# Patient Record
Sex: Female | Born: 1945 | Race: Black or African American | Hispanic: No | Marital: Married | Smoking: Former smoker
Health system: Southern US, Community
[De-identification: ages and names within clinical notes are randomized; demographics above are authoritative.]

## PROBLEM LIST (undated history)

## (undated) DIAGNOSIS — I10 Essential (primary) hypertension: Secondary | ICD-10-CM

## (undated) DIAGNOSIS — M1990 Unspecified osteoarthritis, unspecified site: Secondary | ICD-10-CM

## (undated) HISTORY — DX: Unspecified osteoarthritis, unspecified site: M19.90

## (undated) HISTORY — PX: JOINT REPLACEMENT: SHX530

## (undated) HISTORY — PX: ABDOMINAL HYSTERECTOMY: SHX81

## (undated) HISTORY — PX: WISDOM TOOTH EXTRACTION: SHX21

## (undated) HISTORY — DX: Essential (primary) hypertension: I10

## (undated) HISTORY — PX: HERNIA REPAIR: SHX51

---

## 1984-04-20 HISTORY — PX: WISDOM TOOTH EXTRACTION: SHX21

## 1985-04-20 HISTORY — PX: ABDOMINAL HYSTERECTOMY: SHX81

## 2000-04-20 HISTORY — PX: FOOT SURGERY: SHX648

## 2006-04-20 HISTORY — PX: TOTAL HIP ARTHROPLASTY: SHX124

## 2014-02-06 ENCOUNTER — Ambulatory Visit (INDEPENDENT_AMBULATORY_CARE_PROVIDER_SITE_OTHER): Payer: Medicare Other | Admitting: Internal Medicine

## 2014-02-06 ENCOUNTER — Encounter: Payer: Self-pay | Admitting: Internal Medicine

## 2014-02-06 ENCOUNTER — Encounter: Payer: Self-pay | Admitting: *Deleted

## 2014-02-06 VITALS — BP 134/80 | HR 56 | Temp 98.2°F | Resp 16 | Ht 64.25 in | Wt 170.0 lb

## 2014-02-06 DIAGNOSIS — Z720 Tobacco use: Secondary | ICD-10-CM

## 2014-02-06 DIAGNOSIS — F1290 Cannabis use, unspecified, uncomplicated: Secondary | ICD-10-CM

## 2014-02-06 DIAGNOSIS — M542 Cervicalgia: Secondary | ICD-10-CM

## 2014-02-06 DIAGNOSIS — K219 Gastro-esophageal reflux disease without esophagitis: Secondary | ICD-10-CM | POA: Insufficient documentation

## 2014-02-06 DIAGNOSIS — Z90721 Acquired absence of ovaries, unilateral: Secondary | ICD-10-CM

## 2014-02-06 DIAGNOSIS — M150 Primary generalized (osteo)arthritis: Secondary | ICD-10-CM

## 2014-02-06 DIAGNOSIS — I10 Essential (primary) hypertension: Secondary | ICD-10-CM

## 2014-02-06 DIAGNOSIS — M1990 Unspecified osteoarthritis, unspecified site: Secondary | ICD-10-CM | POA: Insufficient documentation

## 2014-02-06 DIAGNOSIS — M159 Polyosteoarthritis, unspecified: Secondary | ICD-10-CM

## 2014-02-06 DIAGNOSIS — Z90710 Acquired absence of both cervix and uterus: Secondary | ICD-10-CM

## 2014-02-06 DIAGNOSIS — F17200 Nicotine dependence, unspecified, uncomplicated: Secondary | ICD-10-CM | POA: Insufficient documentation

## 2014-02-06 MED ORDER — CELECOXIB 100 MG PO CAPS: ORAL_CAPSULE | ORAL | Status: DC

## 2014-02-06 MED ORDER — PANTOPRAZOLE SODIUM 40 MG PO TBEC: 40.0000 mg | DELAYED_RELEASE_TABLET | Freq: Every day | ORAL | Status: DC

## 2014-02-06 NOTE — Progress Notes (Signed)
Subjective:    Patient ID: Kathryn Pruitt, female    DOB: [birth year], 68 y.o.   MRN: [id number]  HPI  New pt here for first visit for primary care.  She is a sister of a current pt [facility].  She is here with her husband.   Kathryn Pruitt has just moved back to [place] from [place].  She has lived and received care from [employer] base most of adult life  PMH DJD with bil hip replacment,  HTN,  Tobacco use,  And  Episodic marijuana use.  She is S/P hsyt with left oopherectomy.   She reports she is doing well,  Continues to smoke.  But has pain along neck and upper shoulders that has been chronic for her.  She has been on Tramadol and long term .Celebrex for this.  Denies injury or trauma.  Would occasionally use MJ for discomfort that was obatined from [place].    She has been on Celebrex for years  She would like ot try to come off this   Has  Occasional reflux.  Did not like Nexium  No Known Allergies Past Medical History  Diagnosis Date  . Hypertension   . Arthritis    Past Surgical History  Procedure Laterality Date  . Joint replacement    . Total hip arthroplasty Bilateral 2008  . Foot surgery Bilateral 2002  . Abdominal hysterectomy    . Wisdom tooth extraction     History   Social History  . Marital Status: Married    Spouse Name: N/A    Number of Children: N/A  . Years of Education: N/A   Occupational History  . Not on file.   Social History Main Topics  . Smoking status: Former Smoker -- 0.50 packs/day for 20 years    Types: Cigarettes  . Smokeless tobacco: Never Used  . Alcohol Use: Yes     Comment: A glass of wine about once a month   . Drug Use: Yes    Special: Marijuana     Comment: She smokes marijuana socially   . Sexual Activity: Yes    Partners: Male   Other Topics Concern  . Not on file   Social History Narrative  . No narrative on file   Family History  Problem Relation Age of Onset  . Lung cancer Mother   . Hypertension Mother   . Hypertension  Brother   . Bone cancer Maternal Grandmother    There are no active problems to display for this patient.  No current outpatient prescriptions on file prior to visit.   No current facility-administered medications on file prior to visit.      Review of Systems See HPI    Objective:   Physical Exam  Physical Exam  Nursing note and vitals reviewed.  See repeat BP Constitutional: She is oriented to person, place, and time. She appears well-developed and well-nourished.  HENT:  Head: Normocephalic and atraumatic.  Neck  NO pain upon ROM of neck.  She does have pain to palpation of Right shoulder blade Cardiovascular: Normal rate and regular rhythm. Exam reveals no gallop and no friction rub.  No murmur heard.  Pulmonary/Chest: Breath sounds normal. She has no wheezes. She has no rales.  Neurological: She is alert and oriented to person, place, and time.  Skin: Skin is warm and dry.  Psychiatric: She has a normal mood and affect. Her behavior is normal.  Assessment & Plan:  HTN  Continue meds  Tobacco use  Advised cessation she is no tready now   GERD  RX Protonix 40 mg daily  Chronic neck pain  Will refer to Dr. Niel Hummer for non narcotic pain management   Advised of recent study noting CV ,  MI and stroke risk with long term NSAIDS.  Will reduce Celebrex to 100 mg dialy  Ok to take EX tylenol in evening  DJD   See above   Labs todays  Schedule CPE

## 2014-02-07 LAB — CBC WITH DIFFERENTIAL/PLATELET
Basophils Absolute: 0 10*3/uL (ref 0.0–0.1)
Basophils Relative: 0 % (ref 0–1)
Eosinophils Absolute: 0.2 10*3/uL (ref 0.0–0.7)
Eosinophils Relative: 2 % (ref 0–5)
HCT: 42.6 % (ref 36.0–46.0)
HEMOGLOBIN: 14.3 g/dL (ref 12.0–15.0)
LYMPHS ABS: 3.5 10*3/uL (ref 0.7–4.0)
LYMPHS PCT: 41 % (ref 12–46)
MCH: 29.7 pg (ref 26.0–34.0)
MCHC: 33.6 g/dL (ref 30.0–36.0)
MCV: 88.6 fL (ref 78.0–100.0)
MONOS PCT: 6 % (ref 3–12)
Monocytes Absolute: 0.5 10*3/uL (ref 0.1–1.0)
Neutro Abs: 4.4 10*3/uL (ref 1.7–7.7)
Neutrophils Relative %: 51 % (ref 43–77)
PLATELETS: 261 10*3/uL (ref 150–400)
RBC: 4.81 MIL/uL (ref 3.87–5.11)
RDW: 14.7 % (ref 11.5–15.5)
WBC: 8.6 10*3/uL (ref 4.0–10.5)

## 2014-02-07 LAB — COMPLETE METABOLIC PANEL WITH GFR
ALBUMIN: 4 g/dL (ref 3.5–5.2)
ALT: 13 U/L (ref 0–35)
AST: 21 U/L (ref 0–37)
Alkaline Phosphatase: 72 U/L (ref 39–117)
BUN: 16 mg/dL (ref 6–23)
CALCIUM: 9.2 mg/dL (ref 8.4–10.5)
CHLORIDE: 104 meq/L (ref 96–112)
CO2: 29 mEq/L (ref 19–32)
Creat: 0.81 mg/dL (ref 0.50–1.10)
GFR, Est African American: 88 mL/min
GFR, Est Non African American: 76 mL/min
GLUCOSE: 86 mg/dL (ref 70–99)
POTASSIUM: 3.9 meq/L (ref 3.5–5.3)
Sodium: 140 mEq/L (ref 135–145)
TOTAL PROTEIN: 6.7 g/dL (ref 6.0–8.3)
Total Bilirubin: 0.7 mg/dL (ref 0.2–1.2)

## 2014-02-07 LAB — VITAMIN D 25 HYDROXY (VIT D DEFICIENCY, FRACTURES): VIT D 25 HYDROXY: 46 ng/mL (ref 30–89)

## 2014-02-07 LAB — LIPID PANEL
CHOL/HDL RATIO: 3.2 ratio
CHOLESTEROL: 214 mg/dL — AB (ref 0–200)
HDL: 67 mg/dL (ref >39)
LDL Cholesterol: 129 mg/dL — ABNORMAL HIGH (ref 0–99)
Triglycerides: 89 mg/dL (ref <150)
VLDL: 18 mg/dL (ref 0–40)

## 2014-02-07 LAB — TSH: TSH: 1.517 u[IU]/mL (ref 0.350–4.500)

## 2014-02-12 NOTE — Progress Notes (Signed)
Labs mailed to patient with a copy of the DASH Diet-eh

## 2014-02-28 ENCOUNTER — Encounter: Payer: Self-pay | Admitting: *Deleted

## 2014-05-03 ENCOUNTER — Other Ambulatory Visit: Payer: Self-pay | Admitting: *Deleted

## 2014-05-03 NOTE — Telephone Encounter (Signed)
Refill request

## 2014-05-06 ENCOUNTER — Other Ambulatory Visit: Payer: Self-pay | Admitting: Internal Medicine

## 2014-05-06 NOTE — Telephone Encounter (Signed)
Kathryn Pruitt  Call pt and let her know that her pain management Md Dr. Niel Hummer needs to be ordering all her pain meds including Celebrex

## 2014-05-07 NOTE — Telephone Encounter (Signed)
Eron is aware that Dr. Kirk RuthsAce Gins needs to be refilling all pain meds including Celebrex.

## 2014-05-14 ENCOUNTER — Other Ambulatory Visit: Payer: Self-pay | Admitting: *Deleted

## 2014-05-14 MED ORDER — PANTOPRAZOLE SODIUM 40 MG PO TBEC: 40.0000 mg | DELAYED_RELEASE_TABLET | Freq: Every day | ORAL | Status: DC

## 2014-05-14 NOTE — Telephone Encounter (Signed)
Refill request

## 2014-07-15 NOTE — Progress Notes (Addendum)
Subjective:    Patient ID: Kathryn Pruitt, female    DOB: Jun 02, 1945, 69 y.o.   MRN: [id number]  HPI 02/06/2014 New pt here for first visit for primary care. She is a sister of a current pt Kathryn Pruitt. She is here with her husband.   Kathryn Pruitt has just moved back to [place] from [place]. She has lived and received care from [employer] [facility] base most of adult life  PMH DJD with bil hip replacment, HTN, Tobacco use, And Episodic marijuana use. She is S/P hsyt with left oopherectomy.   She reports she is doing well, Continues to smoke. But has pain along neck and upper shoulders that has been chronic for her. She has been on Tramadol and long term .Celebrex for this. Denies injury or trauma. Would occasionally use MJ for discomfort that was obatined from [place]. She has been on Celebrex for years She would like ot try to come off this   Has Occasional reflux. Did not like Nexium Assessment & Plan:  HTN Continue meds  Tobacco use Advised cessation she is no tready now   GERD RX Protonix 40 mg daily  Chronic neck pain Will refer to Dr. Niel Hummer for non narcotic pain management Advised of recent study noting CV , MI and stroke risk with long term NSAIDS. Will reduce Celebrex to 100 mg dialy Ok to take EX tylenol in evening  DJD See above   Labs todays Kathryn Pruitt is here for CPE  HM  Had colonoscopy 2014 in [place] per pt,  She is due for mm, S/P hysterectomy,   Quit tobacco 20 years ago  Smoked off and on  < 30 pack year  HTN  Tolerating meds fine  DJD  She sees Dr.Dalton Ace Gins for pain control.   She still smoked marijauna on her "bad days"  No Known Allergies Past Medical History  Diagnosis Date  . Hypertension   . Arthritis    Past Surgical History  Procedure Laterality Date  . Joint replacement    . Total hip arthroplasty Bilateral 2008  . Foot surgery Bilateral 2002  . Abdominal hysterectomy    . Wisdom tooth  extraction     History   Social History  . Marital Status: Married    Spouse Name: N/A  . Number of Children: N/A  . Years of Education: N/A   Occupational History  . Not on file.   Social History Main Topics  . Smoking status: Former Smoker -- 0.50 packs/day for 20 years    Types: Cigarettes  . Smokeless tobacco: Never Used  . Alcohol Use: Yes     Comment: A glass of wine about once a month   . Drug Use: Yes    Special: Marijuana     Comment: She smokes marijuana socially   . Sexual Activity:    Partners: Male   Other Topics Concern  . Not on file   Social History Narrative  . No narrative on file   Family History  Problem Relation Age of Onset  . Lung cancer Mother   . Hypertension Mother   . Hypertension Brother   . Bone cancer Maternal Grandmother    Patient Active Problem List   Diagnosis Date Noted  . DJD (degenerative joint disease) 02/06/2014  . HTN (hypertension) 02/06/2014  . Tobacco use 02/06/2014  . Marijuana use, episodic 02/06/2014  . S/P hysterectomy with oophorectomy  has right ovary  02/06/2014  .  GERD (gastroesophageal reflux disease) 02/06/2014   Current Outpatient Prescriptions on File Prior to Visit  Medication Sig Dispense Refill  . celecoxib (CELEBREX) 100 MG capsule Take one daily 30 capsule 1  . felodipine (PLENDIL) 10 MG 24 hr tablet Take 10 mg by mouth daily.    . Fish Oil-Cholecalciferol (FISH OIL + D3) 1000-1000 MG-UNIT CAPS Take by mouth.    . fosinopril (MONOPRIL) 20 MG tablet Take 20 mg by mouth daily.    . hydrochlorothiazide (MICROZIDE) 12.5 MG capsule Take 12.5 mg by mouth daily.    . pantoprazole (PROTONIX) 40 MG tablet Take 1 tablet (40 mg total) by mouth daily. 90 tablet 1  . traMADol (ULTRAM) 50 MG tablet Take 50 mg by mouth every 6 (six) hours as needed.     No current facility-administered medications on file prior to visit.        Review of Systems  Respiratory: Negative for cough, chest tightness, shortness of  breath and wheezing.   Cardiovascular: Negative for chest pain, palpitations and leg swelling.       Objective:   Physical Exam  Physical Exam  Nursing note and vitals reviewed.  Constitutional: She is oriented to person, place, and time. She appears well-developed and well-nourished.  HENT:  Head: Normocephalic and atraumatic.  Right Ear: Tympanic membrane and ear canal normal. No drainage. Tympanic membrane is not injected and not erythematous.  Left Ear: Tympanic membrane and ear canal normal. No drainage. Tympanic membrane is not injected and not erythematous.  Nose: Nose normal. Right sinus exhibits no maxillary sinus tenderness and no frontal sinus tenderness. Left sinus exhibits no maxillary sinus tenderness and no frontal sinus tenderness.  Mouth/Throat: Oropharynx is clear and moist. No oral lesions. No oropharyngeal exudate.  Eyes: Conjunctivae and EOM are normal. Pupils are equal, round, and reactive to light.  Neck: Normal range of motion. Neck supple. No JVD present. Carotid bruit is not present. No mass and no thyromegaly present.  Cardiovascular: Normal rate, regular rhythm, S1 normal, S2 normal and intact distal pulses. Exam reveals no gallop and no friction rub.  No murmur heard.  Pulses:  Carotid pulses are 2+ on the right side, and 2+ on the left side.  Dorsalis pedis pulses are 2+ on the right side, and 2+ on the left side.  No carotid bruit. No LE edema  Pulmonary/Chest: Breath sounds normal. She has no wheezes. She has no rales. She exhibits no tenderness.  Breast no discrete mass no nipple discharge no axillary adenopathy bilaterally  Abdominal: Soft. Bowel sounds are normal. She exhibits no distension and no mass. There is no hepatosplenomegaly. There is no tenderness. There is no CVA tenderness.   Rectal no mass guaiac neg  Musculoskeletal: Normal range of motion.  No active synovitis to joints.  Lymphadenopathy:  She has no cervical adenopathy.  She has no  axillary adenopathy.  Right: No inguinal and no supraclavicular adenopathy present.  Left: No inguinal and no supraclavicular adenopathy present.  Neurological: She is alert and oriented to person, place, and time. She has normal strength and normal reflexes. She displays no tremor. No cranial nerve deficit or sensory deficit. Coordination and gait normal.  Skin: Skin is warm and dry. No rash noted. No cyanosis. Nails show no clubbing.  Psychiatric: She has a normal mood and affect. Her speech is normal and behavior is normal. Cognition and memory are normal.          Assessment & Plan:  HM:  Will schedule 3 D mm and DEXA.   S/P hyst  < 30 pack year smoker,  Quit 20 years ago  Colonoscopy done 2014 in [place]  HTN  Continue meds  Check chemistries today   GERD continue PPI  DJD / chronic pain  Managed by Dr Ace Gins   Addendum 4/25  See mm [facility]  Repeat films done 4/1 and compared to 2013 show left breast mass stable and felt to be benign.  Routine one year screening recommended.  I did not palpate a mass on exam

## 2014-07-16 ENCOUNTER — Encounter: Payer: Self-pay | Admitting: Internal Medicine

## 2014-07-16 ENCOUNTER — Other Ambulatory Visit: Payer: Self-pay | Admitting: *Deleted

## 2014-07-16 ENCOUNTER — Telehealth: Payer: Self-pay | Admitting: *Deleted

## 2014-07-16 ENCOUNTER — Ambulatory Visit (INDEPENDENT_AMBULATORY_CARE_PROVIDER_SITE_OTHER): Payer: Medicare Other | Admitting: Internal Medicine

## 2014-07-16 VITALS — BP 136/86 | HR 53 | Resp 16 | Ht 64.25 in | Wt 176.0 lb

## 2014-07-16 DIAGNOSIS — M159 Polyosteoarthritis, unspecified: Secondary | ICD-10-CM

## 2014-07-16 DIAGNOSIS — Z1211 Encounter for screening for malignant neoplasm of colon: Secondary | ICD-10-CM | POA: Diagnosis not present

## 2014-07-16 DIAGNOSIS — Z780 Asymptomatic menopausal state: Secondary | ICD-10-CM

## 2014-07-16 DIAGNOSIS — I10 Essential (primary) hypertension: Secondary | ICD-10-CM | POA: Diagnosis not present

## 2014-07-16 DIAGNOSIS — Z1231 Encounter for screening mammogram for malignant neoplasm of breast: Secondary | ICD-10-CM

## 2014-07-16 DIAGNOSIS — M150 Primary generalized (osteo)arthritis: Secondary | ICD-10-CM | POA: Diagnosis not present

## 2014-07-16 DIAGNOSIS — Z0000 Encounter for general adult medical examination without abnormal findings: Secondary | ICD-10-CM

## 2014-07-16 LAB — COMPREHENSIVE METABOLIC PANEL
ALK PHOS: 91 U/L (ref 39–117)
ALT: 15 U/L (ref 0–35)
AST: 20 U/L (ref 0–37)
Albumin: 4.3 g/dL (ref 3.5–5.2)
BUN: 16 mg/dL (ref 6–23)
CO2: 29 mEq/L (ref 19–32)
CREATININE: 0.94 mg/dL (ref 0.50–1.10)
Calcium: 9.7 mg/dL (ref 8.4–10.5)
Chloride: 98 mEq/L (ref 96–112)
Glucose, Bld: 85 mg/dL (ref 70–99)
Potassium: 3.6 mEq/L (ref 3.5–5.3)
Sodium: 136 mEq/L (ref 135–145)
Total Bilirubin: 1 mg/dL (ref 0.2–1.2)
Total Protein: 6.9 g/dL (ref 6.0–8.3)

## 2014-07-16 LAB — HEMOCCULT GUIAC POC 1CARD (OFFICE): Fecal Occult Blood, POC: NEGATIVE

## 2014-07-16 NOTE — Telephone Encounter (Signed)
I left a message for Kathryn Pruitt letting her know that she is scheduled with [facility] on 07/20/14 @ 2pm for her mammogram and DEXA -eh

## 2014-07-17 ENCOUNTER — Other Ambulatory Visit: Payer: Self-pay | Admitting: *Deleted

## 2014-07-17 ENCOUNTER — Encounter: Payer: Self-pay | Admitting: *Deleted

## 2014-07-17 NOTE — Progress Notes (Signed)
Mailed a copy to patient-eh

## 2014-07-17 NOTE — Telephone Encounter (Signed)
Refill request

## 2014-07-18 MED ORDER — FELODIPINE ER 10 MG PO TB24: 10.0000 mg | ORAL_TABLET | Freq: Every day | ORAL | Status: DC

## 2014-07-18 MED ORDER — HYDROCHLOROTHIAZIDE 12.5 MG PO CAPS: 12.5000 mg | ORAL_CAPSULE | Freq: Every day | ORAL | Status: DC

## 2014-07-18 MED ORDER — FOSINOPRIL SODIUM 20 MG PO TABS: 20.0000 mg | ORAL_TABLET | Freq: Every day | ORAL | Status: DC

## 2014-08-06 ENCOUNTER — Encounter: Payer: Self-pay | Admitting: Internal Medicine

## 2014-08-06 ENCOUNTER — Telehealth: Payer: Self-pay | Admitting: Internal Medicine

## 2014-08-06 ENCOUNTER — Encounter: Payer: Self-pay | Admitting: *Deleted

## 2014-08-06 DIAGNOSIS — M8580 Other specified disorders of bone density and structure, unspecified site: Secondary | ICD-10-CM | POA: Insufficient documentation

## 2014-08-06 NOTE — Telephone Encounter (Signed)
Call pt and let her know that her bone density shows that she does not have osteroporosis   She has early bone thinning osteopenia  She should just take calcium [phone] mg daily with vitamin D [phone] units daily

## 2014-08-06 NOTE — Telephone Encounter (Signed)
I spoke with Kathryn Pruitt in regards to her DEXA -eh

## 2014-08-13 ENCOUNTER — Encounter: Payer: Self-pay | Admitting: Internal Medicine

## 2014-08-13 ENCOUNTER — Encounter: Payer: Self-pay | Admitting: *Deleted

## 2014-08-13 DIAGNOSIS — R928 Other abnormal and inconclusive findings on diagnostic imaging of breast: Secondary | ICD-10-CM | POA: Insufficient documentation

## 2015-03-22 DIAGNOSIS — M5416 Radiculopathy, lumbar region: Secondary | ICD-10-CM | POA: Insufficient documentation

## 2015-03-22 DIAGNOSIS — M5136 Other intervertebral disc degeneration, lumbar region: Secondary | ICD-10-CM | POA: Insufficient documentation

## 2015-11-08 DIAGNOSIS — Z96643 Presence of artificial hip joint, bilateral: Secondary | ICD-10-CM | POA: Insufficient documentation

## 2017-07-15 ENCOUNTER — Emergency Department (HOSPITAL_COMMUNITY)
Admission: EM | Admit: 2017-07-15 | Discharge: 2017-07-15 | Disposition: A | Discharge status: Home / Self Care | Payer: Medicare Other | Attending: Emergency Medicine | Admitting: Emergency Medicine

## 2017-07-15 ENCOUNTER — Other Ambulatory Visit: Payer: Self-pay

## 2017-07-15 ENCOUNTER — Emergency Department (HOSPITAL_COMMUNITY): Payer: Medicare Other

## 2017-07-15 ENCOUNTER — Encounter (HOSPITAL_COMMUNITY): Payer: Self-pay

## 2017-07-15 DIAGNOSIS — Z87891 Personal history of nicotine dependence: Secondary | ICD-10-CM | POA: Insufficient documentation

## 2017-07-15 DIAGNOSIS — R112 Nausea with vomiting, unspecified: Secondary | ICD-10-CM | POA: Insufficient documentation

## 2017-07-15 DIAGNOSIS — R42 Dizziness and giddiness: Secondary | ICD-10-CM | POA: Diagnosis not present

## 2017-07-15 DIAGNOSIS — I10 Essential (primary) hypertension: Secondary | ICD-10-CM | POA: Diagnosis not present

## 2017-07-15 DIAGNOSIS — Z79899 Other long term (current) drug therapy: Secondary | ICD-10-CM | POA: Insufficient documentation

## 2017-07-15 LAB — URINALYSIS, ROUTINE W REFLEX MICROSCOPIC
BILIRUBIN URINE: NEGATIVE
Glucose, UA: NEGATIVE mg/dL
Hgb urine dipstick: NEGATIVE
Ketones, ur: 20 mg/dL — AB
Leukocytes, UA: NEGATIVE
NITRITE: NEGATIVE
Protein, ur: NEGATIVE mg/dL
Specific Gravity, Urine: 1.008 (ref 1.005–1.030)
pH: 8 (ref 5.0–8.0)

## 2017-07-15 LAB — CBC WITH DIFFERENTIAL/PLATELET
BASOS ABS: 0 10*3/uL (ref 0.0–0.1)
BASOS PCT: 0 %
EOS PCT: 0 %
Eosinophils Absolute: 0 10*3/uL (ref 0.0–0.7)
HCT: 45.5 % (ref 36.0–46.0)
Hemoglobin: 15.2 g/dL — ABNORMAL HIGH (ref 12.0–15.0)
Lymphocytes Relative: 17 %
Lymphs Abs: 2.2 10*3/uL (ref 0.7–4.0)
MCH: 30.5 pg (ref 26.0–34.0)
MCHC: 33.4 g/dL (ref 30.0–36.0)
MCV: 91.4 fL (ref 78.0–100.0)
MONO ABS: 0.7 10*3/uL (ref 0.1–1.0)
Monocytes Relative: 6 %
Neutro Abs: 10.1 10*3/uL — ABNORMAL HIGH (ref 1.7–7.7)
Neutrophils Relative %: 77 %
PLATELETS: 348 10*3/uL (ref 150–400)
RBC: 4.98 MIL/uL (ref 3.87–5.11)
RDW: 14.2 % (ref 11.5–15.5)
WBC: 13.1 10*3/uL — ABNORMAL HIGH (ref 4.0–10.5)

## 2017-07-15 LAB — COMPREHENSIVE METABOLIC PANEL
ALBUMIN: 4.1 g/dL (ref 3.5–5.0)
ALT: 17 U/L (ref 14–54)
ANION GAP: 12 (ref 5–15)
AST: 25 U/L (ref 15–41)
Alkaline Phosphatase: 75 U/L (ref 38–126)
BILIRUBIN TOTAL: 0.6 mg/dL (ref 0.3–1.2)
BUN: 18 mg/dL (ref 6–20)
CO2: 26 mmol/L (ref 22–32)
Calcium: 9.5 mg/dL (ref 8.9–10.3)
Chloride: 104 mmol/L (ref 101–111)
Creatinine, Ser: 0.9 mg/dL (ref 0.44–1.00)
GFR calc Af Amer: >60 mL/min (ref >60)
GFR calc non Af Amer: >60 mL/min (ref >60)
GLUCOSE: 114 mg/dL — AB (ref 65–99)
Potassium: 3.4 mmol/L — ABNORMAL LOW (ref 3.5–5.1)
Sodium: 142 mmol/L (ref 135–145)
TOTAL PROTEIN: 7.7 g/dL (ref 6.5–8.1)

## 2017-07-15 LAB — TROPONIN I: Troponin I: <0.03 ng/mL (ref <0.03)

## 2017-07-15 MED ORDER — MECLIZINE HCL 25 MG PO TABS
25.0000 mg | ORAL_TABLET | Freq: Once | ORAL | Status: AC
  Administered 2017-07-15: 25 mg via ORAL
  Filled 2017-07-15: qty 1

## 2017-07-15 MED ORDER — MECLIZINE HCL 25 MG PO TABS: 25.0000 mg | ORAL_TABLET | Freq: Three times a day (TID) | ORAL | 0 refills | Status: DC | PRN

## 2017-07-15 MED ORDER — ONDANSETRON 4 MG PO TBDP: 4.0000 mg | ORAL_TABLET | Freq: Three times a day (TID) | ORAL | 0 refills | Status: DC | PRN

## 2017-07-15 NOTE — ED Provider Notes (Signed)
[facility] DEPT Provider Note   CSN: [id number] Arrival date & time: 07/15/17  0758     History   Chief Complaint Chief Complaint  Patient presents with  . Dizziness    HPI Kathryn Pruitt is a 72 y.o. female.  Pt presents to the ED today with dizziness, n/v.  She said she woke up with sx this morning and felt like the room was spinning.  The pt said she laid down in bed and still felt like she was falling.  The pt did have to take some amox pretreatment for a recent dental procedure and she has felt a little sick to her stomach since then.  She also stopped taking a stomach medicine that starts with an "m."  She had been taking that since December as rx'd by GI, but stopped b/c she felt better and the packet told her not to take it longer than 12 weeks.  EMS gave pt 8 mg of zofran IV and n/v has improved.  She still feels a little dizzy with head movement.     Past Medical History:  Diagnosis Date  . Arthritis   . Hypertension     Patient Active Problem List   Diagnosis Date Noted  . Abnormal mammogram see [facility] mm stable left breast mass since 2013 felt to be benign  08/13/2014  . Osteopenia  Dexa done 07/20/2014  [facility] 08/06/2014  . DJD (degenerative joint disease) 02/06/2014  . HTN (hypertension) 02/06/2014  . Tobacco use disorder  quit 20 years ago  02/06/2014  . Marijuana use, episodic 02/06/2014  . S/P hysterectomy with oophorectomy  has right ovary  02/06/2014  . GERD (gastroesophageal reflux disease) 02/06/2014    Past Surgical History:  Procedure Laterality Date  . ABDOMINAL HYSTERECTOMY    . FOOT SURGERY Bilateral 2002  . JOINT REPLACEMENT    . TOTAL HIP ARTHROPLASTY Bilateral 2008  . WISDOM TOOTH EXTRACTION       OB History   None      Home Medications    Prior to Admission medications   Medication Sig Start Date End Date Taking? Authorizing Provider  BIOTIN 5000 PO Take 5,000 mg by mouth daily.   Yes [provider]  celecoxib (CELEBREX) 100 MG capsule Take one daily 02/06/14  Yes Schoenhoff, Altamese Cabal, MD  felodipine (PLENDIL) 10 MG 24 hr tablet Take 1 tablet (10 mg total) by mouth daily. 07/18/14  Yes Schoenhoff, Altamese Cabal, MD  fosinopril (MONOPRIL) 20 MG tablet Take 1 tablet (20 mg total) by mouth daily. 07/18/14  Yes Schoenhoff, Altamese Cabal, MD  hydrochlorothiazide (MICROZIDE) 12.5 MG capsule Take 1 capsule (12.5 mg total) by mouth daily. 07/18/14  Yes Schoenhoff, Altamese Cabal, MD  metoCLOPramide (REGLAN) 10 MG tablet Take 10 mg by mouth 2 (two) times daily. 06/22/17  Yes [provider]  pantoprazole (PROTONIX) 40 MG tablet Take 1 tablet (40 mg total) by mouth daily. 05/14/14  Yes Schoenhoff, Altamese Cabal, MD  meclizine (ANTIVERT) 25 MG tablet Take 1 tablet (25 mg total) by mouth 3 (three) times daily as needed for dizziness. 07/15/17   Isla Pence, MD  ondansetron (ZOFRAN ODT) 4 MG disintegrating tablet Take 1 tablet (4 mg total) by mouth every 8 (eight) hours as needed. 07/15/17   Isla Pence, MD    Family History Family History  Problem Relation Age of Onset  . Lung cancer Mother   . Hypertension Mother   . Hypertension Brother   . Bone  cancer Maternal Grandmother     Social History Social History   Tobacco Use  . Smoking status: Former Smoker    Packs/day: 0.50    Years: 20.00    Pack years: 10.00    Types: Cigarettes  . Smokeless tobacco: Never Used  Substance Use Topics  . Alcohol use: Yes    Comment: A glass of wine about once a month   . Drug use: Yes    Types: Marijuana    Comment: She smokes marijuana socially      Allergies   Patient has no known allergies.   Review of Systems Review of Systems  Gastrointestinal: Positive for nausea and vomiting.  Neurological: Positive for dizziness.  All other systems reviewed and are negative.    Physical Exam Updated Vital Signs BP (!) 151/92 (BP Location: Right Arm)   Pulse 62   Temp 97.9 F (36.6 C)  (Oral)   Resp 16   Ht 5\' 5"  (1.651 m)   Wt 79.4 kg (175 lb)   SpO2 100%   BMI 29.12 kg/m   Physical Exam  Constitutional: She is oriented to person, place, and time. She appears well-developed and well-nourished.  HENT:  Head: Normocephalic and atraumatic.  Right Ear: External ear normal.  Left Ear: External ear normal.  Nose: Nose normal.  Mouth/Throat: Mucous membranes are dry.  Eyes: Pupils are equal, round, and reactive to light. Conjunctivae and EOM are normal.  Neck: Normal range of motion. Neck supple.  Cardiovascular: Normal rate, regular rhythm, normal heart sounds and intact distal pulses.  Pulmonary/Chest: Effort normal and breath sounds normal.  Abdominal: Soft. Bowel sounds are normal.  Musculoskeletal: Normal range of motion.  Neurological: She is alert and oriented to person, place, and time.  Skin: Skin is warm and dry. Capillary refill takes less than 2 seconds.  Psychiatric: She has a normal mood and affect. Her behavior is normal. Judgment and thought content normal.  Nursing note and vitals reviewed.    ED Treatments / Results  Labs (all labs ordered are listed, but only abnormal results are displayed) Labs Reviewed  COMPREHENSIVE METABOLIC PANEL - Abnormal; Notable for the following components:      Result Value   Potassium 3.4 (*)    Glucose, Bld 114 (*)    All other components within normal limits  CBC WITH DIFFERENTIAL/PLATELET - Abnormal; Notable for the following components:   WBC 13.1 (*)    Hemoglobin 15.2 (*)    Neutro Abs 10.1 (*)    All other components within normal limits  URINALYSIS, ROUTINE W REFLEX MICROSCOPIC - Abnormal; Notable for the following components:   Color, Urine STRAW (*)    Ketones, ur 20 (*)    All other components within normal limits  TROPONIN I    EKG EKG Interpretation  Date/Time:  Thursday July 15 2017 08:09:01 EDT Ventricular Rate:  62 PR Interval:    QRS Duration: 98 QT Interval:  456 QTC  Calculation: 464 R Axis:   54 Text Interpretation:  Sinus rhythm Confirmed by Isla Pence [phone]) on 07/15/2017 8:46:00 AM   Radiology Ct Head Wo Contrast  Result Date: 07/15/2017 CLINICAL DATA:  Headache, dizziness, nausea, vomiting EXAM: CT HEAD WITHOUT CONTRAST TECHNIQUE: Contiguous axial images were obtained from the base of the skull through the vertex without intravenous contrast. COMPARISON:  None. FINDINGS: Brain: Low-density in the parietal lobes bilaterally compatible with chronic microvascular disease. No acute intracranial abnormality. Specifically, no hemorrhage, hydrocephalus, mass lesion, acute infarction, or  significant intracranial injury. Vascular: No hyperdense vessel or unexpected calcification. Skull: No acute calvarial abnormality. Sinuses/Orbits: Visualized paranasal sinuses and mastoids clear. Orbital soft tissues unremarkable. Other: None IMPRESSION: Deep white matter low density in the parietal lobes compatible chronic microvascular ischemic changes. No acute intracranial abnormality. Electronically Signed   By: Rolm Baptise M.D.   On: 07/15/2017 09:23    Procedures Procedures (including critical care time)  Medications Ordered in ED Medications  meclizine (ANTIVERT) tablet 25 mg (25 mg Oral Given 07/15/17 0827)     Initial Impression / Assessment and Plan / ED Course  I have reviewed the triage vital signs and the nursing notes.  Pertinent labs & imaging results that were available during my care of the patient were reviewed by me and considered in my medical decision making (see chart for details).     Pt is feeling much better.  She is able to tolerate po fluids and walk without problems.  She is stable for d/c.  Final Clinical Impressions(s) / ED Diagnoses   Final diagnoses:  Vertigo    ED Discharge Orders        Ordered    ondansetron (ZOFRAN ODT) 4 MG disintegrating tablet  Every 8 hours PRN     07/15/17 1011    meclizine (ANTIVERT) 25 MG  tablet  3 times daily PRN     07/15/17 1011       Isla Pence, MD 07/15/17 1012

## 2017-07-15 NOTE — ED Triage Notes (Addendum)
Headache, dizziness, nausea, vomiting, "room spinning". Relieved with 8mg  zofran. 2300 normal. Woke up with symptoms. CBG 147 . VS EMS 147/74 HR 64 97% RA. Pt states she has been taking sinus pressure medicine and has been feeling some pulling on her left ear. Pt also states shivering.

## 2017-07-15 NOTE — ED Notes (Signed)
Pt ambulated without complication.

## 2017-09-21 DIAGNOSIS — R42 Dizziness and giddiness: Secondary | ICD-10-CM | POA: Insufficient documentation

## 2017-09-21 DIAGNOSIS — H903 Sensorineural hearing loss, bilateral: Secondary | ICD-10-CM | POA: Insufficient documentation

## 2019-05-11 DIAGNOSIS — Z9109 Other allergy status, other than to drugs and biological substances: Secondary | ICD-10-CM | POA: Insufficient documentation

## 2019-05-31 DIAGNOSIS — A609 Anogenital herpesviral infection, unspecified: Secondary | ICD-10-CM | POA: Insufficient documentation

## 2019-11-30 ENCOUNTER — Other Ambulatory Visit: Payer: Self-pay

## 2019-11-30 ENCOUNTER — Encounter (HOSPITAL_BASED_OUTPATIENT_CLINIC_OR_DEPARTMENT_OTHER): Payer: Self-pay

## 2019-11-30 DIAGNOSIS — I10 Essential (primary) hypertension: Secondary | ICD-10-CM | POA: Insufficient documentation

## 2019-11-30 DIAGNOSIS — Z87891 Personal history of nicotine dependence: Secondary | ICD-10-CM | POA: Diagnosis not present

## 2019-11-30 DIAGNOSIS — M5432 Sciatica, left side: Secondary | ICD-10-CM | POA: Insufficient documentation

## 2019-11-30 DIAGNOSIS — Z96643 Presence of artificial hip joint, bilateral: Secondary | ICD-10-CM | POA: Insufficient documentation

## 2019-11-30 DIAGNOSIS — Z79899 Other long term (current) drug therapy: Secondary | ICD-10-CM | POA: Diagnosis not present

## 2019-11-30 NOTE — ED Triage Notes (Addendum)
Pt c/o lower back pain that radiates down left LE-denies injury-to triage using [place]/slow gait-states she does not normally use [place]-denies w/c

## 2019-12-01 ENCOUNTER — Emergency Department (HOSPITAL_BASED_OUTPATIENT_CLINIC_OR_DEPARTMENT_OTHER)
Admission: EM | Admit: 2019-12-01 | Discharge: 2019-12-01 | Disposition: A | Discharge status: Home / Self Care | Payer: Medicare Other | Attending: Emergency Medicine | Admitting: Emergency Medicine

## 2019-12-01 ENCOUNTER — Emergency Department (HOSPITAL_BASED_OUTPATIENT_CLINIC_OR_DEPARTMENT_OTHER): Payer: Medicare Other

## 2019-12-01 DIAGNOSIS — M5432 Sciatica, left side: Secondary | ICD-10-CM | POA: Diagnosis not present

## 2019-12-01 MED ORDER — HYDROCODONE-ACETAMINOPHEN 5-325 MG PO TABS: 1.0000 | ORAL_TABLET | Freq: Four times a day (QID) | ORAL | 0 refills | Status: DC | PRN

## 2019-12-01 MED ORDER — HYDROCODONE-ACETAMINOPHEN 5-325 MG PO TABS
1.0000 | ORAL_TABLET | Freq: Once | ORAL | Status: AC
  Administered 2019-12-01: 1 via ORAL
  Filled 2019-12-01: qty 1

## 2019-12-01 NOTE — ED Provider Notes (Signed)
[facility] Provider Note: Georgena Spurling, MD, FACEP  CSN: [id number] MRN: [id number] ARRIVAL: 11/30/19 at 2119 ROOM: [place]  Back Pain   HISTORY OF PRESENT ILLNESS  12/01/19 1:50 AM Kathryn Pruitt is a 74 y.o. female with a 1 week history of pain in her right lower back radiating to her right buttock and down to her right knee.  She rates the pain as a 10 out of 10, sharp in nature.  It is worse with movement of the right hip and with certain positions.  She denies any recent injury.  She has been using a Chern to ambulate due to pain.  She denies any numbness or weakness.  She denies any change in bowel or bladder function.  She took gabapentin yesterday without relief.   Past Medical History:  Diagnosis Date  . Arthritis   . Hypertension     Past Surgical History:  Procedure Laterality Date  . ABDOMINAL HYSTERECTOMY    . FOOT SURGERY Bilateral 2002  . JOINT REPLACEMENT    . TOTAL HIP ARTHROPLASTY Bilateral 2008  . WISDOM TOOTH EXTRACTION      Family History  Problem Relation Age of Onset  . Lung cancer Mother   . Hypertension Mother   . Hypertension Brother   . Bone cancer Maternal Grandmother     Social History   Tobacco Use  . Smoking status: Former Smoker    Packs/day: 0.50    Years: 20.00    Pack years: 10.00    Types: Cigarettes  . Smokeless tobacco: Never Used  Vaping Use  . Vaping Use: Never used  Substance Use Topics  . Alcohol use: Not Currently  . Drug use: Not Currently    Types: Marijuana    Prior to Admission medications   Medication Sig Start Date End Date Taking? Authorizing Provider  BIOTIN 5000 PO Take 5,000 mg by mouth daily.    [provider]  celecoxib (CELEBREX) 100 MG capsule Take one daily 02/06/14   Schoenhoff, Altamese Cabal, MD  felodipine (PLENDIL) 10 MG 24 hr tablet Take 1 tablet (10 mg total) by mouth daily. 07/18/14   Schoenhoff, Altamese Cabal, MD  fosinopril (MONOPRIL) 20 MG tablet Take  1 tablet (20 mg total) by mouth daily. 07/18/14   Schoenhoff, Altamese Cabal, MD  hydrochlorothiazide (MICROZIDE) 12.5 MG capsule Take 1 capsule (12.5 mg total) by mouth daily. 07/18/14   Schoenhoff, Altamese Cabal, MD  HYDROcodone-acetaminophen (NORCO) 5-325 MG tablet Take 1 tablet by mouth every 6 (six) hours as needed for severe pain. 12/01/19   Sonny Poth, MD  metoCLOPramide (REGLAN) 10 MG tablet Take 10 mg by mouth 2 (two) times daily. 06/22/17 12/01/19  [provider]  pantoprazole (PROTONIX) 40 MG tablet Take 1 tablet (40 mg total) by mouth daily. 05/14/14 12/01/19  Schoenhoff, Altamese Cabal, MD    Allergies Patient has no known allergies.   REVIEW OF SYSTEMS  Negative except as noted here or in the History of Present Illness.   PHYSICAL EXAMINATION  Initial Vital Signs Blood pressure (!) 178/92, pulse 70, temperature 98.6 F (37 C), temperature source Oral, resp. rate 18, height 5\' 5"  (1.651 m), weight 82.1 kg, SpO2 100 %.  Examination General: Well-developed, well-nourished female in no acute distress; appearance consistent with age of record HENT: normocephalic; atraumatic Eyes: Normal appearance Neck: supple Heart: regular rate and rhythm Lungs: clear to auscultation bilaterally Abdomen: soft; nondistended; nontender; bowel sounds present Back: Left lower tenderness; positive  straight leg raise on the left Extremities: No deformity; full range of motion; pulses normal Neurologic: Awake, alert and oriented; motor function intact in all extremities and symmetric; sensation intact and symmetric in the lower extremities; no facial droop Skin: Warm and dry Psychiatric: Normal mood and affect   RESULTS  Summary of this visit's results, reviewed and interpreted by myself:   EKG Interpretation  Date/Time:    Ventricular Rate:    PR Interval:    QRS Duration:   QT Interval:    QTC Calculation:   R Axis:     Text Interpretation:        Laboratory Studies: No results found  for this or any previous visit (from the past 24 hour(s)). Imaging Studies: DG Lumbar Spine Complete  Result Date: 12/01/2019 CLINICAL DATA:  74 year old female with low back pain radiating down the left lower extremity. EXAM: LUMBAR SPINE - COMPLETE 4+ VIEW COMPARISON:  None. FINDINGS: Normal lumbar segmentation. Mild straightening of lumbar lordosis. No spondylolisthesis. Preserved lumbar vertebral height. No pars fracture. Sacrum and SI joints appear intact. No acute osseous abnormality identified. Moderate to severe disc space loss with vacuum disc and endplate spurring at B1-Y7 and L5-S1. Up to moderate facet hypertrophy also at those levels. Preserved disc spaces elsewhere. Partially visible bilateral hip arthroplasty. Aortoiliac calcified atherosclerosis. Negative other abdominal visceral contours. Partially visible tiny surgical clip in the pelvis. IMPRESSION: 1. No acute osseous abnormality identified in the lumbar spine. 2. 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 3.  Aortic Atherosclerosis (ICD10-I70.0). Electronically Signed   By: Genevie Ann M.D.   On: 12/01/2019 01:37    ED COURSE and MDM  Nursing notes, initial and subsequent vitals signs, including pulse oximetry, reviewed and interpreted by myself.  Vitals:   11/30/19 2132 11/30/19 2335 11/30/19 2344 12/01/19 0154  BP: (!) 170/101 (!) 144/99 (!) 178/92 (!) 161/89  Pulse: 70 (!) 130 70 73  Resp: 20 18 18 20   Temp: 98.5 F (36.9 C) 99.8 F (37.7 C) 98.6 F (37 C)   TempSrc: Oral Oral Oral   SpO2: 100% 100% 100% 98%  Weight:      Height:       Medications  HYDROcodone-acetaminophen (NORCO/VICODIN) 5-325 MG per tablet 1 tablet (has no administration in time range)    Presentation is consistent with sciatica.  No evidence of tumor or fracture on plain films.  We will treat with hydrocodone and refer to neurosurgery.  PROCEDURES  Procedures   ED DIAGNOSES     ICD-10-CM   1. Sciatica of  left side  M54.32        Jenet Durio, MD 12/01/19 [id number]

## 2019-12-01 NOTE — ED Notes (Signed)
ED Provider at bedside. 

## 2019-12-11 DIAGNOSIS — Z6830 Body mass index (BMI) 30.0-30.9, adult: Secondary | ICD-10-CM | POA: Insufficient documentation

## 2020-01-23 LAB — HM MAMMOGRAPHY

## 2020-10-01 ENCOUNTER — Ambulatory Visit: Payer: Medicare Other | Admitting: Family

## 2020-11-01 ENCOUNTER — Emergency Department (HOSPITAL_BASED_OUTPATIENT_CLINIC_OR_DEPARTMENT_OTHER)
Admission: EM | Admit: 2020-11-01 | Discharge: 2020-11-01 | Disposition: A | Discharge status: Home / Self Care | Payer: Medicare Other | Attending: Emergency Medicine | Admitting: Emergency Medicine

## 2020-11-01 ENCOUNTER — Other Ambulatory Visit: Payer: Self-pay

## 2020-11-01 ENCOUNTER — Encounter (HOSPITAL_BASED_OUTPATIENT_CLINIC_OR_DEPARTMENT_OTHER): Payer: Self-pay | Admitting: Emergency Medicine

## 2020-11-01 ENCOUNTER — Other Ambulatory Visit (HOSPITAL_BASED_OUTPATIENT_CLINIC_OR_DEPARTMENT_OTHER): Payer: Self-pay

## 2020-11-01 DIAGNOSIS — I10 Essential (primary) hypertension: Secondary | ICD-10-CM | POA: Diagnosis not present

## 2020-11-01 DIAGNOSIS — R000 Tachycardia, unspecified: Secondary | ICD-10-CM | POA: Insufficient documentation

## 2020-11-01 DIAGNOSIS — Z79899 Other long term (current) drug therapy: Secondary | ICD-10-CM | POA: Insufficient documentation

## 2020-11-01 DIAGNOSIS — M542 Cervicalgia: Secondary | ICD-10-CM | POA: Diagnosis present

## 2020-11-01 DIAGNOSIS — Z96643 Presence of artificial hip joint, bilateral: Secondary | ICD-10-CM | POA: Diagnosis not present

## 2020-11-01 DIAGNOSIS — M62838 Other muscle spasm: Secondary | ICD-10-CM | POA: Insufficient documentation

## 2020-11-01 MED ORDER — METHOCARBAMOL 500 MG PO TABS
500.0000 mg | ORAL_TABLET | Freq: Once | ORAL | Status: AC
  Administered 2020-11-01: 500 mg via ORAL
  Filled 2020-11-01: qty 1

## 2020-11-01 MED ORDER — METHOCARBAMOL 500 MG PO TABS
500.0000 mg | ORAL_TABLET | Freq: Two times a day (BID) | ORAL | 0 refills | Status: DC
  Filled 2020-11-01: qty 20, 10d supply, fill #0

## 2020-11-01 NOTE — ED Triage Notes (Signed)
PT reports left sided neck pain. Pt reporting reporting "shooting sensation in my arms when I reach them out in front of me."

## 2020-11-01 NOTE — ED Provider Notes (Signed)
[facility] EMERGENCY DEPARTMENT Provider Note   CSN: [id number] Arrival date & time: 11/01/20  0859     History Chief Complaint  Patient presents with   Neck Pain    Ramah Bezold is a 75 y.o. female.  The history is provided by the patient.  Neck Pain Pain location:  L side Quality:  Aching, cramping and shooting Pain radiates to:  L scapula Pain severity:  Moderate Pain is:  Same all the time Onset quality:  Gradual Duration:  3 days Timing:  Constant Progression:  Worsening Chronicity:  New Context comment:  No known injury.  No recent new activities. Relieved by: Some improvement with heat and gabapentin. Worsened by:  Position Ineffective treatments:  None tried Associated symptoms: no chest pain, no fever, no headaches, no leg pain, no numbness, no paresis and no weakness   Risk factors comment:  History of hypertension and arthritis     Past Medical History:  Diagnosis Date   Arthritis    Hypertension     Patient Active Problem List   Diagnosis Date Noted   Abnormal mammogram see [facility] mm stable left breast mass since 2013 felt to be benign  08/13/2014   Osteopenia  Dexa done 07/20/2014  [facility] 08/06/2014   DJD (degenerative joint disease) 02/06/2014   HTN (hypertension) 02/06/2014   Tobacco use disorder  quit 20 years ago  02/06/2014   Marijuana use, episodic 02/06/2014   S/P hysterectomy with oophorectomy  has right ovary  02/06/2014   GERD (gastroesophageal reflux disease) 02/06/2014    Past Surgical History:  Procedure Laterality Date   ABDOMINAL HYSTERECTOMY     FOOT SURGERY Bilateral 2002   JOINT REPLACEMENT     TOTAL HIP ARTHROPLASTY Bilateral 2008   WISDOM TOOTH EXTRACTION       OB History   No obstetric history on file.     Family History  Problem Relation Age of Onset   Lung cancer Mother    Hypertension Mother    Hypertension Brother    Bone cancer Maternal Grandmother     Social History   Tobacco Use   Smoking  status: Former    Packs/day: 0.50    Years: 20.00    Pack years: 10.00    Types: Cigarettes   Smokeless tobacco: Never  Vaping Use   Vaping Use: Never used  Substance Use Topics   Alcohol use: Not Currently   Drug use: Not Currently    Types: Marijuana    Home Medications Prior to Admission medications   Medication Sig Start Date End Date Taking? Authorizing Provider  BIOTIN 5000 PO Take 5,000 mg by mouth daily.    [provider]  celecoxib (CELEBREX) 100 MG capsule Take one daily 02/06/14   Schoenhoff, Altamese Cabal, MD  felodipine (PLENDIL) 10 MG 24 hr tablet Take 1 tablet (10 mg total) by mouth daily. 07/18/14   Schoenhoff, Altamese Cabal, MD  fosinopril (MONOPRIL) 20 MG tablet Take 1 tablet (20 mg total) by mouth daily. 07/18/14   Schoenhoff, Altamese Cabal, MD  hydrochlorothiazide (MICROZIDE) 12.5 MG capsule Take 1 capsule (12.5 mg total) by mouth daily. 07/18/14   Schoenhoff, Altamese Cabal, MD  HYDROcodone-acetaminophen (NORCO) 5-325 MG tablet Take 1 tablet by mouth every 6 (six) hours as needed for severe pain. 12/01/19   Molpus, John, MD  metoCLOPramide (REGLAN) 10 MG tablet Take 10 mg by mouth 2 (two) times daily. 06/22/17 12/01/19  [provider]  pantoprazole (PROTONIX) 40 MG tablet Take  1 tablet (40 mg total) by mouth daily. 05/14/14 12/01/19  Schoenhoff, Altamese Cabal, MD    Allergies    Patient has no known allergies.  Review of Systems   Review of Systems  Constitutional:  Negative for fever.  Cardiovascular:  Negative for chest pain.  Musculoskeletal:  Positive for neck pain.  Neurological:  Negative for weakness, numbness and headaches.  All other systems reviewed and are negative.  Physical Exam Updated Vital Signs BP (!) 167/91   Pulse 77   Temp 97.8 F (36.6 C) (Oral)   Resp 18   SpO2 100%   Physical Exam Vitals and nursing note reviewed.  Constitutional:      General: She is not in acute distress.    Appearance: Normal appearance. She is normal weight.   HENT:     Head: Normocephalic and atraumatic.  Eyes:     Extraocular Movements: Extraocular movements intact.     Pupils: Pupils are equal, round, and reactive to light.  Neck:   Cardiovascular:     Rate and Rhythm: Regular rhythm. Tachycardia present.     Pulses: Normal pulses.  Pulmonary:     Effort: Pulmonary effort is normal. No respiratory distress.     Breath sounds: Normal breath sounds. No wheezing.  Musculoskeletal:        General: No tenderness.     Cervical back: Normal range of motion and neck supple.     Right lower leg: No edema.     Left lower leg: No edema.  Skin:    General: Skin is warm and dry.  Neurological:     General: No focal deficit present.     Mental Status: She is alert and oriented to person, place, and time. Mental status is at baseline.     Sensory: No sensory deficit.     Motor: No weakness.  Psychiatric:        Mood and Affect: Mood normal.        Behavior: Behavior normal.    ED Results / Procedures / Treatments   Labs (all labs ordered are listed, but only abnormal results are displayed) Labs Reviewed - No data to display  EKG None  Radiology No results found.  Procedures Procedures   Medications Ordered in ED Medications  methocarbamol (ROBAXIN) tablet 500 mg (has no administration in time range)    ED Course  I have reviewed the triage vital signs and the nursing notes.  Pertinent labs & imaging results that were available during my care of the patient were reviewed by me and considered in my medical decision making (see chart for details).    MDM Rules/Calculators/A&P                          Pleasant 75 year old female presenting today with complaints of left-sided neck pain.  This seems to be musculoskeletal in nature.  She is not having radicular symptoms running down the arm and has normal pulse.  Low suspicion for vascular issue or DVT.  Encourage patient to continue to use Tylenol, lidocaine patches and was given  Robaxin.  Final Clinical Impression(s) / ED Diagnoses Final diagnoses:  Muscle spasms of neck    Rx / DC Orders ED Discharge Orders          Ordered    methocarbamol (ROBAXIN) 500 MG tablet  2 times daily        11/01/20 1005  Blanchie Dessert, MD 11/01/20 1005

## 2020-11-01 NOTE — Discharge Instructions (Addendum)
Continue to try heating pad, you can try lidocaine patch, keep taking the Tylenol and try the muscle relaxer.  Therapeutic massage may also be helpful to loosen those muscles.

## 2020-12-05 ENCOUNTER — Ambulatory Visit (INDEPENDENT_AMBULATORY_CARE_PROVIDER_SITE_OTHER): Payer: Medicare Other | Admitting: Family

## 2020-12-05 ENCOUNTER — Encounter: Payer: Self-pay | Admitting: Family

## 2020-12-05 ENCOUNTER — Other Ambulatory Visit: Payer: Self-pay

## 2020-12-05 VITALS — BP 118/76 | HR 64 | Temp 97.3°F | Resp 18 | Ht 65.0 in | Wt 189.0 lb

## 2020-12-05 DIAGNOSIS — E041 Nontoxic single thyroid nodule: Secondary | ICD-10-CM

## 2020-12-05 DIAGNOSIS — E538 Deficiency of other specified B group vitamins: Secondary | ICD-10-CM | POA: Diagnosis not present

## 2020-12-05 DIAGNOSIS — K219 Gastro-esophageal reflux disease without esophagitis: Secondary | ICD-10-CM | POA: Diagnosis not present

## 2020-12-05 DIAGNOSIS — M8949 Other hypertrophic osteoarthropathy, multiple sites: Secondary | ICD-10-CM | POA: Diagnosis not present

## 2020-12-05 DIAGNOSIS — I10 Essential (primary) hypertension: Secondary | ICD-10-CM | POA: Diagnosis not present

## 2020-12-05 DIAGNOSIS — R42 Dizziness and giddiness: Secondary | ICD-10-CM

## 2020-12-05 DIAGNOSIS — M159 Polyosteoarthritis, unspecified: Secondary | ICD-10-CM

## 2020-12-05 DIAGNOSIS — N644 Mastodynia: Secondary | ICD-10-CM

## 2020-12-05 DIAGNOSIS — R0789 Other chest pain: Secondary | ICD-10-CM | POA: Diagnosis not present

## 2020-12-05 DIAGNOSIS — E785 Hyperlipidemia, unspecified: Secondary | ICD-10-CM

## 2020-12-05 DIAGNOSIS — E559 Vitamin D deficiency, unspecified: Secondary | ICD-10-CM | POA: Diagnosis not present

## 2020-12-05 LAB — LIPID PANEL
Cholesterol: 262 mg/dL — ABNORMAL HIGH (ref 0–200)
HDL: 64.6 mg/dL (ref >39.00)
LDL Cholesterol: 169 mg/dL — ABNORMAL HIGH (ref 0–99)
NonHDL: 197.25
Total CHOL/HDL Ratio: 4
Triglycerides: 139 mg/dL (ref 0.0–149.0)
VLDL: 27.8 mg/dL (ref 0.0–40.0)

## 2020-12-05 LAB — COMPREHENSIVE METABOLIC PANEL
ALT: 14 U/L (ref 0–35)
AST: 17 U/L (ref 0–37)
Albumin: 4.1 g/dL (ref 3.5–5.2)
Alkaline Phosphatase: 86 U/L (ref 39–117)
BUN: 17 mg/dL (ref 6–23)
CO2: 30 mEq/L (ref 19–32)
Calcium: 9.8 mg/dL (ref 8.4–10.5)
Chloride: 105 mEq/L (ref 96–112)
Creatinine, Ser: 0.92 mg/dL (ref 0.40–1.20)
GFR: 61.98 mL/min (ref >60.00)
Glucose, Bld: 87 mg/dL (ref 70–99)
Potassium: 3.9 mEq/L (ref 3.5–5.1)
Sodium: 142 mEq/L (ref 135–145)
Total Bilirubin: 0.9 mg/dL (ref 0.2–1.2)
Total Protein: 6.8 g/dL (ref 6.0–8.3)

## 2020-12-05 LAB — CBC WITH DIFFERENTIAL/PLATELET
Basophils Absolute: 0.1 10*3/uL (ref 0.0–0.1)
Basophils Relative: 0.8 % (ref 0.0–3.0)
Eosinophils Absolute: 0.1 10*3/uL (ref 0.0–0.7)
Eosinophils Relative: 1.5 % (ref 0.0–5.0)
HCT: 44 % (ref 36.0–46.0)
Hemoglobin: 15.1 g/dL — ABNORMAL HIGH (ref 12.0–15.0)
Lymphocytes Relative: 43.3 % (ref 12.0–46.0)
Lymphs Abs: 2.9 10*3/uL (ref 0.7–4.0)
MCHC: 34.3 g/dL (ref 30.0–36.0)
MCV: 89.6 fl (ref 78.0–100.0)
Monocytes Absolute: 0.4 10*3/uL (ref 0.1–1.0)
Monocytes Relative: 6.4 % (ref 3.0–12.0)
Neutro Abs: 3.2 10*3/uL (ref 1.4–7.7)
Neutrophils Relative %: 48 % (ref 43.0–77.0)
Platelets: 243 10*3/uL (ref 150.0–400.0)
RBC: 4.91 Mil/uL (ref 3.87–5.11)
RDW: 14.8 % (ref 11.5–15.5)
WBC: 6.7 10*3/uL (ref 4.0–10.5)

## 2020-12-05 LAB — VITAMIN D 25 HYDROXY (VIT D DEFICIENCY, FRACTURES): VITD: 45.71 ng/mL (ref 30.00–100.00)

## 2020-12-05 LAB — VITAMIN B12: Vitamin B-12: 200 pg/mL — ABNORMAL LOW (ref 211–911)

## 2020-12-05 LAB — TSH: TSH: 2.19 u[IU]/mL (ref 0.35–5.50)

## 2020-12-05 MED ORDER — HYDROCHLOROTHIAZIDE 12.5 MG PO CAPS: 12.5000 mg | ORAL_CAPSULE | Freq: Every day | ORAL | 1 refills | Status: DC

## 2020-12-05 MED ORDER — FELODIPINE ER 10 MG PO TB24: 10.0000 mg | ORAL_TABLET | Freq: Every day | ORAL | 1 refills | Status: DC

## 2020-12-05 MED ORDER — FOSINOPRIL SODIUM 20 MG PO TABS: 20.0000 mg | ORAL_TABLET | Freq: Every day | ORAL | 1 refills | Status: DC

## 2020-12-05 MED ORDER — MECLIZINE HCL 25 MG PO TABS: 25.0000 mg | ORAL_TABLET | Freq: Three times a day (TID) | ORAL | 0 refills | Status: DC | PRN

## 2020-12-05 MED ORDER — PANTOPRAZOLE SODIUM 40 MG PO TBEC: 40.0000 mg | DELAYED_RELEASE_TABLET | Freq: Every day | ORAL | 1 refills | Status: DC

## 2020-12-05 MED ORDER — CELECOXIB 100 MG PO CAPS: ORAL_CAPSULE | ORAL | 1 refills | Status: DC

## 2020-12-05 NOTE — Progress Notes (Signed)
Kathryn Pruitt is a 75 y.o. female with the following history as recorded in [employer]:  Patient Active Problem List   Diagnosis Date Noted   Abnormal mammogram see [facility] mm stable left breast mass since 2013 felt to be benign  08/13/2014   Osteopenia  Dexa done 07/20/2014  [facility] 08/06/2014   DJD (degenerative joint disease) 02/06/2014   HTN (hypertension) 02/06/2014   Tobacco use disorder  quit 20 years ago  02/06/2014   Marijuana use, episodic 02/06/2014   S/P hysterectomy with oophorectomy  has right ovary  02/06/2014   GERD (gastroesophageal reflux disease) 02/06/2014    Current Outpatient Medications  Medication Sig Dispense Refill   loratadine (CLARITIN) 10 MG tablet Take 10 mg by mouth daily.     meclizine (ANTIVERT) 25 MG tablet Take 1 tablet (25 mg total) by mouth 3 (three) times daily as needed for dizziness. 30 tablet 0   pantoprazole (PROTONIX) 40 MG tablet Take 1 tablet (40 mg total) by mouth daily. 90 tablet 1   celecoxib (CELEBREX) 100 MG capsule Take one daily 90 capsule 1   felodipine (PLENDIL) 10 MG 24 hr tablet Take 1 tablet (10 mg total) by mouth daily. 90 tablet 1   fosinopril (MONOPRIL) 20 MG tablet Take 1 tablet (20 mg total) by mouth daily. 90 tablet 1   hydrochlorothiazide (MICROZIDE) 12.5 MG capsule Take 1 capsule (12.5 mg total) by mouth daily. 90 capsule 1   No current facility-administered medications for this visit.    Allergies: Prednisone  Past Medical History:  Diagnosis Date   Arthritis    Hypertension     Past Surgical History:  Procedure Laterality Date   ABDOMINAL HYSTERECTOMY     FOOT SURGERY Bilateral 2002   JOINT REPLACEMENT     TOTAL HIP ARTHROPLASTY Bilateral 2008   WISDOM TOOTH EXTRACTION      Family History  Problem Relation Age of Onset   Lung cancer Mother    Hypertension Mother    Hypertension Brother    Bone cancer Maternal Grandmother     Social History   Tobacco Use   Smoking status: Former    Packs/day: 0.50    Years:  20.00    Pack years: 10.00    Types: Cigarettes   Smokeless tobacco: Never  Substance Use Topics   Alcohol use: Not Currently    Subjective:   Presents today as a new patient; History of hypertension- stable on current regimen; needs refill on medications; Denies any chest pain, shortness of breath, blurred vision or headache.  History of vertigo- was under care of ENT at [facility] and did vestibular rehab; does take Claritin 10 mg daily; does not want to use any type of nasal spray;   GI- colonoscopy done January 2022;  Thyroid ultrasound being done every 2 years for follow up on nodules;  Notes has been prescribed Crestor in the past but could not tolerate due to muscle aches; has been prescribed Zetia but is not currently taking;   Notes that she just aches all the time; patient has suspicion that she has fibromyalgia; sister has fibromyalgia; does mention that she had some pain over her right breast recently; no chest pain or shortness of breath on exertion;      Objective:  Vitals:   12/05/20 1000  BP: 118/76  Pulse: 64  Resp: 18  Temp: (!) 97.3 F (36.3 C)  SpO2: 98%  Weight: 189 lb (85.7 kg)  Height: 5' 5"  (1.651 m)  General: Well developed, well nourished, in no acute distress  Skin : Warm and dry.  Head: Normocephalic and atraumatic  Eyes: Sclera and conjunctiva clear; pupils round and reactive to light; extraocular movements intact  Ears: External normal; canals clear; tympanic membranes normal  Oropharynx: Pink, supple. No suspicious lesions  Neck: Supple without thyromegaly, adenopathy  Lungs: Respirations unlabored; clear to auscultation bilaterally without wheeze, rales, rhonchi  CVS exam: normal rate and regular rhythm.  Abdomen: Soft; nontender; nondistended; normoactive bowel sounds; no masses or hepatosplenomegaly  Musculoskeletal: No deformities; no active joint inflammation  Extremities: No edema, cyanosis, clubbing  Vessels: Symmetric  bilaterally  Neurologic: Alert and oriented; speech intact; face symmetrical; moves all extremities well; CNII-XII intact without focal deficit   Assessment:  1. Primary hypertension   2. Primary osteoarthritis involving multiple joints   3. Gastroesophageal reflux disease without esophagitis   4. Vertigo   5. Breast pain, right   6. Atypical chest pain   7. Hyperlipidemia, unspecified hyperlipidemia type   8. Thyroid nodule   9. Vitamin D deficiency   10. Low vitamin B12 level     Stable; refill updated; Refill for Celebrex updated; Refill for Protonix updated; patient has signed record release from [facility] to review most recent GI results; Trial of Antivert 25 mg tid; if symptoms persist, to refer back to her ENT; Diagnostic mammogram ordered; EKG shows sinus rhythm; suspect muscular; Check lipid panel; patient has Rx for Zetia but not taking; did not like statin in the past; Check Tsh; will get copy of last thyroid ultrasound; Check Vitamin D; Check B12;  This visit occurred during the SARS-CoV-2 public health emergency.  Safety protocols were in place, including screening questions prior to the visit, additional usage of staff PPE, and extensive cleaning of exam room while observing appropriate contact time as indicated for disinfecting solutions.    No follow-ups on file.  Orders Placed This Encounter  Procedures   MM DIAG BREAST TOMO BILATERAL    Standing Status:   Future    Standing Expiration Date:   12/05/2021    Order Specific Question:   Reason for Exam (SYMPTOM  OR DIAGNOSIS REQUIRED)    Answer:   right breast pain    Order Specific Question:   Preferred imaging location?    Answer:   [facility]   CBC with Differential/Platelet   Comp Met (CMET)   Lipid panel   TSH   Vitamin D (25 hydroxy)   B12   EKG 12-Lead     Requested Prescriptions   Signed Prescriptions Disp Refills   hydrochlorothiazide (MICROZIDE) 12.5 MG capsule 90 capsule 1    Sig:  Take 1 capsule (12.5 mg total) by mouth daily.   felodipine (PLENDIL) 10 MG 24 hr tablet 90 tablet 1    Sig: Take 1 tablet (10 mg total) by mouth daily.   fosinopril (MONOPRIL) 20 MG tablet 90 tablet 1    Sig: Take 1 tablet (20 mg total) by mouth daily.   pantoprazole (PROTONIX) 40 MG tablet 90 tablet 1    Sig: Take 1 tablet (40 mg total) by mouth daily.   celecoxib (CELEBREX) 100 MG capsule 90 capsule 1    Sig: Take one daily   meclizine (ANTIVERT) 25 MG tablet 30 tablet 0    Sig: Take 1 tablet (25 mg total) by mouth 3 (three) times daily as needed for dizziness.

## 2020-12-06 ENCOUNTER — Other Ambulatory Visit: Payer: Self-pay | Admitting: Family

## 2020-12-06 DIAGNOSIS — N644 Mastodynia: Secondary | ICD-10-CM

## 2020-12-11 ENCOUNTER — Other Ambulatory Visit: Payer: Self-pay

## 2020-12-11 ENCOUNTER — Ambulatory Visit (INDEPENDENT_AMBULATORY_CARE_PROVIDER_SITE_OTHER): Payer: Medicare Other

## 2020-12-11 DIAGNOSIS — E538 Deficiency of other specified B group vitamins: Secondary | ICD-10-CM | POA: Diagnosis not present

## 2020-12-11 MED ORDER — CYANOCOBALAMIN 1000 MCG/ML IJ SOLN
1000.0000 ug | INTRAMUSCULAR | Status: DC
  Administered 2020-12-11: 1000 ug via INTRAMUSCULAR

## 2020-12-11 NOTE — Progress Notes (Signed)
Pt here for monthly B12 injection per Mickel Baas  B12 1080mg given left IM, and pt tolerated injection well.  Next B12 injection scheduled for 12/18/20 at 1030

## 2020-12-17 ENCOUNTER — Telehealth: Payer: Self-pay

## 2020-12-17 NOTE — Telephone Encounter (Signed)
I have called pt and her husband Fritz Pickerel answered. Pt was in the background and confirmed her name and dob.   We have received a disk of medical records from [place] and we are unable to access it. I have informed them that they would have to get the disk and print out what they want [place] to have and submit it. Pt and her husband stated understanding and will come pick it up.   I have placed the disk in the pt pick up area labeled with name and BDAY.

## 2020-12-18 ENCOUNTER — Ambulatory Visit (INDEPENDENT_AMBULATORY_CARE_PROVIDER_SITE_OTHER): Payer: Medicare Other | Admitting: *Deleted

## 2020-12-18 ENCOUNTER — Other Ambulatory Visit: Payer: Self-pay

## 2020-12-18 DIAGNOSIS — E538 Deficiency of other specified B group vitamins: Secondary | ICD-10-CM | POA: Diagnosis not present

## 2020-12-18 MED ORDER — CYANOCOBALAMIN 1000 MCG/ML IJ SOLN
1000.0000 ug | Freq: Once | INTRAMUSCULAR | Status: AC
  Administered 2020-12-18: 1000 ug via INTRAMUSCULAR

## 2020-12-18 NOTE — Progress Notes (Signed)
Kathryn Pruitt is a 75 y.o. female presents to the office today for b12 injection, per physician's orders.  Today is 2 of 4 weekly injections.  Original order: from labs 12/05/20 weekly B12 shots x 4 weeks and then see me in the office in 6 weeks for follow up.   b12  1027mg IM was administered in left deltoid today. Patient tolerated injection. Patient due for follow up labs/provider appt: Yes. Date due: 01/21/21, appt made Yes Patient next injection due: 12/25/20, appt made Yes

## 2020-12-25 ENCOUNTER — Ambulatory Visit (INDEPENDENT_AMBULATORY_CARE_PROVIDER_SITE_OTHER): Payer: Medicare Other | Admitting: *Deleted

## 2020-12-25 ENCOUNTER — Other Ambulatory Visit: Payer: Self-pay

## 2020-12-25 DIAGNOSIS — E538 Deficiency of other specified B group vitamins: Secondary | ICD-10-CM | POA: Diagnosis not present

## 2020-12-25 MED ORDER — CYANOCOBALAMIN 1000 MCG/ML IJ SOLN
1000.0000 ug | Freq: Once | INTRAMUSCULAR | Status: AC
  Administered 2020-12-25: 1000 ug via INTRAMUSCULAR

## 2020-12-25 NOTE — Progress Notes (Signed)
Kathryn Pruitt is a 75 y.o. female presents to the office today for b12 injection, per physician's orders.  Today is 3 of 4 weekly injections.   Original order: from labs 12/05/20 weekly B12 shots x 4 weeks and then see me in the office in 6 weeks for follow up.    b12  1086mg IM was administered in left deltoid today. Patient tolerated injection. Patient due for follow up labs/provider appt: Yes. Date due: 01/21/21, appt made Yes Patient next injection due: 01/01/21, appt made Yes

## 2021-01-01 ENCOUNTER — Other Ambulatory Visit: Payer: Self-pay

## 2021-01-01 ENCOUNTER — Ambulatory Visit (INDEPENDENT_AMBULATORY_CARE_PROVIDER_SITE_OTHER): Payer: Medicare Other

## 2021-01-01 DIAGNOSIS — E538 Deficiency of other specified B group vitamins: Secondary | ICD-10-CM | POA: Diagnosis not present

## 2021-01-01 MED ORDER — CYANOCOBALAMIN 1000 MCG/ML IJ SOLN
1000.0000 ug | Freq: Once | INTRAMUSCULAR | Status: AC
  Administered 2021-01-01: 1000 ug via INTRAMUSCULAR

## 2021-01-01 NOTE — Progress Notes (Signed)
Kathryn Pruitt is a 75 y.o. female presents to the office today for 4th weekly B12 injection, per physician's orders. Original order: 12/05/20 weekly B12 shots x 4 weeks and then see me in the office in 6 weeks for follow up.  B12 1000 mcg IM was administered in L deltoid today. Patient tolerated injection. Patient due for follow up labs/provider appt: appointment pending on 01/21/2021 with Kathryn Pruitt Patient next injection due: That day.   Kathryn Pruitt not in office, Kathryn Blinks MD is [employer], Kathryn Pruitt

## 2021-01-13 ENCOUNTER — Ambulatory Visit
Admission: RE | Admit: 2021-01-13 | Discharge: 2021-01-13 | Disposition: A | Discharge status: Home / Self Care | Payer: Medicare Other | Source: Ambulatory Visit | Attending: Family | Admitting: Family

## 2021-01-13 ENCOUNTER — Ambulatory Visit: Payer: Medicare Other

## 2021-01-13 ENCOUNTER — Other Ambulatory Visit: Payer: Self-pay

## 2021-01-13 DIAGNOSIS — N644 Mastodynia: Secondary | ICD-10-CM

## 2021-01-21 ENCOUNTER — Ambulatory Visit (INDEPENDENT_AMBULATORY_CARE_PROVIDER_SITE_OTHER): Payer: Medicare Other | Admitting: Family

## 2021-01-21 ENCOUNTER — Encounter: Payer: Self-pay | Admitting: Family

## 2021-01-21 ENCOUNTER — Other Ambulatory Visit: Payer: Self-pay

## 2021-01-21 VITALS — BP 138/90 | HR 71 | Temp 98.2°F | Ht 65.0 in | Wt 190.2 lb

## 2021-01-21 DIAGNOSIS — Z23 Encounter for immunization: Secondary | ICD-10-CM | POA: Diagnosis not present

## 2021-01-21 DIAGNOSIS — R1030 Lower abdominal pain, unspecified: Secondary | ICD-10-CM | POA: Diagnosis not present

## 2021-01-21 DIAGNOSIS — E538 Deficiency of other specified B group vitamins: Secondary | ICD-10-CM

## 2021-01-21 LAB — COMPREHENSIVE METABOLIC PANEL
ALT: 13 U/L (ref 0–35)
AST: 18 U/L (ref 0–37)
Albumin: 4.4 g/dL (ref 3.5–5.2)
Alkaline Phosphatase: 81 U/L (ref 39–117)
BUN: 19 mg/dL (ref 6–23)
CO2: 27 mEq/L (ref 19–32)
Calcium: 9.6 mg/dL (ref 8.4–10.5)
Chloride: 102 mEq/L (ref 96–112)
Creatinine, Ser: 0.99 mg/dL (ref 0.40–1.20)
GFR: 56.71 mL/min — ABNORMAL LOW (ref >60.00)
Glucose, Bld: 101 mg/dL — ABNORMAL HIGH (ref 70–99)
Potassium: 3.7 mEq/L (ref 3.5–5.1)
Sodium: 140 mEq/L (ref 135–145)
Total Bilirubin: 1.7 mg/dL — ABNORMAL HIGH (ref 0.2–1.2)
Total Protein: 7.1 g/dL (ref 6.0–8.3)

## 2021-01-21 LAB — VITAMIN B12: Vitamin B-12: 563 pg/mL (ref 211–911)

## 2021-01-21 NOTE — Progress Notes (Signed)
Kathryn Pruitt is a 75 y.o. female with the following history as recorded in EpicCare:  Patient Active Problem List   Diagnosis Date Noted   Abnormal mammogram see [facility] mm stable left breast mass since 2013 felt to be benign  08/13/2014   Osteopenia  Dexa done 07/20/2014  [facility] 08/06/2014   DJD (degenerative joint disease) 02/06/2014   HTN (hypertension) 02/06/2014   Tobacco use disorder  quit 20 years ago  02/06/2014   Marijuana use, episodic 02/06/2014   S/P hysterectomy with oophorectomy  has right ovary  02/06/2014   GERD (gastroesophageal reflux disease) 02/06/2014    Current Outpatient Medications  Medication Sig Dispense Refill   celecoxib (CELEBREX) 100 MG capsule Take one daily 90 capsule 1   felodipine (PLENDIL) 10 MG 24 hr tablet Take 1 tablet (10 mg total) by mouth daily. 90 tablet 1   fosinopril (MONOPRIL) 20 MG tablet Take 1 tablet (20 mg total) by mouth daily. 90 tablet 1   hydrochlorothiazide (MICROZIDE) 12.5 MG capsule Take 1 capsule (12.5 mg total) by mouth daily. 90 capsule 1   loratadine (CLARITIN) 10 MG tablet Take 10 mg by mouth daily.     meclizine (ANTIVERT) 25 MG tablet Take 1 tablet (25 mg total) by mouth 3 (three) times daily as needed for dizziness. 30 tablet 0   pantoprazole (PROTONIX) 40 MG tablet Take 1 tablet (40 mg total) by mouth daily. 90 tablet 1   Current Facility-Administered Medications  Medication Dose Route Frequency Provider Last Rate Last Admin   cyanocobalamin ((VITAMIN B-12)) injection 1,000 mcg  1,000 mcg Intramuscular Q30 days Marrian Salvage, FNP   1,000 mcg at 12/11/20 1033    Allergies: Prednisone  Past Medical History:  Diagnosis Date   Arthritis    Hypertension     Past Surgical History:  Procedure Laterality Date   ABDOMINAL HYSTERECTOMY     FOOT SURGERY Bilateral 2002   JOINT REPLACEMENT     TOTAL HIP ARTHROPLASTY Bilateral 2008   WISDOM TOOTH EXTRACTION      Family History  Problem Relation Age of Onset   Lung  cancer Mother    Hypertension Mother    Hypertension Brother    Bone cancer Maternal Grandmother     Social History   Tobacco Use   Smoking status: Former    Packs/day: 0.50    Years: 20.00    Pack years: 10.00    Types: Cigarettes   Smokeless tobacco: Never  Substance Use Topics   Alcohol use: Not Currently    Subjective:   1 month follow up on low B12 level- has been feeling better since starting regular shots; Known history of abdominal hernia- "present for years" but seems to be getting worse recently; has been having increased sensation of bloating/ swelling; no vaginal spotting or bleeding; normal bowel movements- no rectal bleeding;      Objective:  Vitals:   01/21/21 0935  BP: 138/90  Pulse: 71  Temp: 98.2 F (36.8 C)  TempSrc: Oral  SpO2: 98%  Weight: 190 lb 3.2 oz (86.3 kg)  Height: 5' 5"  (1.651 m)    General: Well developed, well nourished, in no acute distress  Skin : Warm and dry.  Head: Normocephalic and atraumatic  Lungs: Respirations unlabored; clear to auscultation bilaterally without wheeze, rales, rhonchi  CVS exam: normal rate and regular rhythm.  Abdomen: Soft; nontender; nondistended; normoactive bowel sounds; bulging noted on exam;  Musculoskeletal: No deformities; no active joint inflammation  Extremities: No edema,  cyanosis, clubbing  Vessels: Symmetric bilaterally  Neurologic: Alert and oriented; speech intact; face symmetrical; moves all extremities well; CNII-XII intact without focal deficit  Pelvic exam- cystocele noted but otherwise unremarkable;   Assessment:  1. Lower abdominal pain   2. Need for influenza vaccination   3. Low vitamin B12 level     Plan:  Suspect abdominal hernia; check CBC, CMP today; order for abdominal/ pelvic CT; follow up to be determined; Flu shot given; Check B12 level today- follow up to be determined;  This visit occurred during the SARS-CoV-2 public health emergency.  Safety protocols were in place,  including screening questions prior to the visit, additional usage of staff PPE, and extensive cleaning of exam room while observing appropriate contact time as indicated for disinfecting solutions.    No follow-ups on file.  Orders Placed This Encounter  Procedures   CT Abdomen Pelvis W Contrast    Labs drawn 10/4 in epic wt: 190 / not diab, has hbp / no kidney problems / nkda to ct dye / no spinal stimulator, body injector or glucose monitor / no port / no needs / mcr/tricare- no auth req'd.//as Epic order/ miriam w pt No to COVID Pt aware of $75 no-show fee. 01/21/2021 pt aware to p/u oral cm/ miriam  mcr/tricare- no auth req'd.//as    Standing Status:   Future    Standing Expiration Date:   01/21/2022    Order Specific Question:   If indicated for the ordered procedure, I authorize the administration of contrast media per Radiology protocol    Answer:   Yes    Order Specific Question:   Preferred imaging location?    Answer:   [id number]    Order Specific Question:   Release to patient    Answer:   Immediate    Order Specific Question:   Is Oral Contrast requested for this exam?    Answer:   Yes, Per Radiology protocol   Flu Vaccine QUAD High Dose(Fluad)   B12   Comp Met (CMET)   CBC with Differential/Platelet    Standing Status:   Future    Standing Expiration Date:   01/21/2022    Requested Prescriptions    No prescriptions requested or ordered in this encounter

## 2021-01-23 ENCOUNTER — Ambulatory Visit
Admission: RE | Admit: 2021-01-23 | Discharge: 2021-01-23 | Disposition: A | Discharge status: Home / Self Care | Payer: Medicare Other | Source: Ambulatory Visit | Attending: Family | Admitting: Family

## 2021-01-23 DIAGNOSIS — R1030 Lower abdominal pain, unspecified: Secondary | ICD-10-CM

## 2021-01-23 MED ORDER — IOPAMIDOL (ISOVUE-300) INJECTION 61%
100.0000 mL | Freq: Once | INTRAVENOUS | Status: AC | PRN
  Administered 2021-01-23: 100 mL via INTRAVENOUS

## 2021-01-24 ENCOUNTER — Other Ambulatory Visit: Payer: Self-pay | Admitting: Family

## 2021-01-24 DIAGNOSIS — K429 Umbilical hernia without obstruction or gangrene: Secondary | ICD-10-CM

## 2021-03-03 DIAGNOSIS — IMO0002 Reserved for concepts with insufficient information to code with codable children: Secondary | ICD-10-CM | POA: Insufficient documentation

## 2021-03-03 DIAGNOSIS — K810 Acute cholecystitis: Secondary | ICD-10-CM | POA: Insufficient documentation

## 2021-03-03 DIAGNOSIS — J309 Allergic rhinitis, unspecified: Secondary | ICD-10-CM | POA: Insufficient documentation

## 2021-03-03 DIAGNOSIS — M25559 Pain in unspecified hip: Secondary | ICD-10-CM | POA: Insufficient documentation

## 2021-03-03 DIAGNOSIS — R3129 Other microscopic hematuria: Secondary | ICD-10-CM | POA: Insufficient documentation

## 2021-03-03 DIAGNOSIS — R12 Heartburn: Secondary | ICD-10-CM | POA: Insufficient documentation

## 2021-03-03 DIAGNOSIS — M999 Biomechanical lesion, unspecified: Secondary | ICD-10-CM | POA: Insufficient documentation

## 2021-03-03 DIAGNOSIS — M792 Neuralgia and neuritis, unspecified: Secondary | ICD-10-CM | POA: Insufficient documentation

## 2021-03-03 DIAGNOSIS — L03119 Cellulitis of unspecified part of limb: Secondary | ICD-10-CM | POA: Insufficient documentation

## 2021-03-03 DIAGNOSIS — E785 Hyperlipidemia, unspecified: Secondary | ICD-10-CM | POA: Insufficient documentation

## 2021-03-03 DIAGNOSIS — M545 Low back pain, unspecified: Secondary | ICD-10-CM | POA: Insufficient documentation

## 2021-03-03 DIAGNOSIS — R6882 Decreased libido: Secondary | ICD-10-CM | POA: Insufficient documentation

## 2021-03-03 DIAGNOSIS — D45 Polycythemia vera: Secondary | ICD-10-CM | POA: Insufficient documentation

## 2021-03-03 DIAGNOSIS — IMO0001 Reserved for inherently not codable concepts without codable children: Secondary | ICD-10-CM | POA: Insufficient documentation

## 2021-03-03 DIAGNOSIS — Z9079 Acquired absence of other genital organ(s): Secondary | ICD-10-CM | POA: Insufficient documentation

## 2021-03-03 DIAGNOSIS — N951 Menopausal and female climacteric states: Secondary | ICD-10-CM | POA: Insufficient documentation

## 2021-03-03 DIAGNOSIS — R209 Unspecified disturbances of skin sensation: Secondary | ICD-10-CM | POA: Insufficient documentation

## 2021-03-03 DIAGNOSIS — G894 Chronic pain syndrome: Secondary | ICD-10-CM | POA: Insufficient documentation

## 2021-03-03 DIAGNOSIS — M9903 Segmental and somatic dysfunction of lumbar region: Secondary | ICD-10-CM | POA: Insufficient documentation

## 2021-03-03 DIAGNOSIS — Z1211 Encounter for screening for malignant neoplasm of colon: Secondary | ICD-10-CM | POA: Insufficient documentation

## 2021-03-03 DIAGNOSIS — G5710 Meralgia paresthetica, unspecified lower limb: Secondary | ICD-10-CM | POA: Insufficient documentation

## 2021-05-16 NOTE — Progress Notes (Signed)
Sent message, via epic in basket, requesting orders in epic from surgeon.  

## 2021-05-26 ENCOUNTER — Encounter (HOSPITAL_COMMUNITY): Admission: RE | Admit: 2021-05-26 | Payer: Medicare Other | Source: Ambulatory Visit

## 2021-05-27 NOTE — Patient Instructions (Addendum)
DUE TO COVID-19 ONLY ONE VISITOR IS ALLOWED TO COME WITH YOU AND STAY IN THE WAITING ROOM ONLY DURING PRE OP AND PROCEDURE DAY OF SURGERY IF YOU ARE GOING HOME AFTER SURGERY. IF YOU ARE SPENDING THE NIGHT 2 PEOPLE MAY VISIT WITH YOU IN YOUR PRIVATE ROOM AFTER SURGERY UNTIL VISITING  HOURS ARE OVER AT 800 PM AND 1  VISITOR  MAY  SPEND THE NIGHT.   YOU NEED TO HAVE A COVID 19 TEST ON__2/15_____ @__12 :00 PM_____, THIS TEST MUST BE DONE BEFORE SURGERY,                 Kathryn Pruitt     Your procedure is scheduled on: 06/06/21   Report to [facility] Main  Entrance   Report to admitting at 12:50 PM     Call this number if you have problems the morning of surgery [phone]    Remember: Do not eat food  :After Midnight the night before your surgery,     You may have clear liquids from midnight until 9:30 AM  Follow any diet and bowel prep instructions from the Dr's office  CLEAR LIQUID DIET   Foods Allowed                                                                     Foods Excluded  Coffee and tea, regular and decaf                             liquids that you cannot  Plain Jell-O any favor except red or purple                                           see through such as: Fruit ices (not with fruit pulp)                                     milk, soups, orange juice  Iced Popsicles                                    All solid food Carbonated beverages, regular and diet                                    Cranberry, grape and apple juices Sports drinks like Gatorade Lightly seasoned clear broth or consume(fat free) Sugar    BRUSH YOUR TEETH MORNING OF SURGERY AND RINSE YOUR MOUTH OUT, NO CHEWING GUM CANDY OR MINTS.     Take these medicines the morning of surgery with A SIP OF WATER: Felodipine, Pantoprazole                                You may not have any metal on your body including hair pins and  piercings  Do not wear jewelry, make-up, lotions,  powders or perfumes, deodorant             Do not wear nail polish on your finger or toenails.  Do not shave  48 hours prior to surgery.                 Do not bring valuables to the hospital. [employer].  Contacts, dentures or bridgework may not be worn into surgery.  Leave suitcase in the car. After surgery it may be brought to your room.                 [place] - Preparing for Surgery Before surgery, you can play an important role.  Because skin is not sterile, your skin needs to be as free of germs as possible.  You can reduce the number of germs on your skin by washing with CHG (chlorahexidine gluconate) soap before surgery.  CHG is an antiseptic cleaner which kills germs and bonds with the skin to continue killing germs even after washing. Please DO NOT use if you have an allergy to CHG or antibacterial soaps.  If your skin becomes reddened/irritated stop using the CHG and inform your nurse when you arrive at Short Stay. Do not shave (including legs and underarms) for at least 48 hours prior to the first CHG shower.   Please follow these instructions carefully:  1.  Shower with CHG Soap the night before surgery and the  morning of Surgery.  2.  If you choose to wash your hair, wash your hair first as usual with your  normal  shampoo.  3.  After you shampoo, rinse your hair and body thoroughly to remove the  shampoo.                            4.  Use CHG as you would any other liquid soap.  You can apply chg directly  to the skin and wash                       Gently with a scrungie or clean washcloth.  5.  Apply the CHG Soap to your body ONLY FROM THE NECK DOWN.   Do not use on face/ open                           Wound or open sores. Avoid contact with eyes, ears mouth and genitals (private parts).                       Wash face,  Genitals (private parts) with your normal soap.             6.  Wash thoroughly, paying special  attention to the area where your surgery  will be performed.  7.  Thoroughly rinse your body with warm water from the neck down.  8.  DO NOT shower/wash with your normal soap after using and rinsing off  the CHG Soap.                9.  Pat yourself dry with a clean towel.            10.  Wear clean pajamas.  11.  Place clean sheets on your bed the night of your first shower and do not  sleep with pets. Day of Surgery : Do not apply any lotions/deodorants the morning of surgery.  Please wear clean clothes to the hospital/surgery center.  FAILURE TO FOLLOW THESE INSTRUCTIONS MAY RESULT IN THE CANCELLATION OF YOUR SURGERY   ________________________________________________________________________

## 2021-05-28 ENCOUNTER — Other Ambulatory Visit: Payer: Self-pay

## 2021-05-28 ENCOUNTER — Encounter (HOSPITAL_COMMUNITY): Payer: Self-pay

## 2021-05-28 ENCOUNTER — Encounter (HOSPITAL_COMMUNITY)
Admission: RE | Admit: 2021-05-28 | Discharge: 2021-05-28 | Disposition: A | Discharge status: Home / Self Care | Payer: Medicare Other | Source: Ambulatory Visit | Attending: Surgery | Admitting: Surgery

## 2021-05-28 VITALS — HR 64 | Temp 98.0°F | Resp 20 | Ht 65.0 in | Wt 195.0 lb

## 2021-05-28 DIAGNOSIS — Z01818 Encounter for other preprocedural examination: Secondary | ICD-10-CM

## 2021-05-28 DIAGNOSIS — I10 Essential (primary) hypertension: Secondary | ICD-10-CM | POA: Diagnosis not present

## 2021-05-28 DIAGNOSIS — Z01812 Encounter for preprocedural laboratory examination: Secondary | ICD-10-CM | POA: Insufficient documentation

## 2021-05-28 LAB — CBC
HCT: 43.9 % (ref 36.0–46.0)
Hemoglobin: 14.8 g/dL (ref 12.0–15.0)
MCH: 30.1 pg (ref 26.0–34.0)
MCHC: 33.7 g/dL (ref 30.0–36.0)
MCV: 89.2 fL (ref 80.0–100.0)
Platelets: 277 10*3/uL (ref 150–400)
RBC: 4.92 MIL/uL (ref 3.87–5.11)
RDW: 14.5 % (ref 11.5–15.5)
WBC: 6.8 10*3/uL (ref 4.0–10.5)
nRBC: 0 % (ref 0.0–0.2)

## 2021-05-28 LAB — BASIC METABOLIC PANEL
Anion gap: 5 (ref 5–15)
BUN: 23 mg/dL (ref 8–23)
CO2: 27 mmol/L (ref 22–32)
Calcium: 9 mg/dL (ref 8.9–10.3)
Chloride: 106 mmol/L (ref 98–111)
Creatinine, Ser: 0.91 mg/dL (ref 0.44–1.00)
GFR, Estimated: >60 mL/min (ref >60)
Glucose, Bld: 108 mg/dL — ABNORMAL HIGH (ref 70–99)
Potassium: 3.5 mmol/L (ref 3.5–5.1)
Sodium: 138 mmol/L (ref 135–145)

## 2021-05-28 NOTE — Progress Notes (Signed)
COVID test 2/15 at 12:00 PM   Bowel prep reminder:NA/ Pt denied  PCP - Jodi Mourning FNP Cardiologist - none  Chest x-ray - no EKG - 12/05/20-epic Stress Test - no ECHO - no Cardiac Cath - no Pacemaker/ICD device last checked:NA  Sleep Study - no CPAP -   Fasting Blood Sugar - NA Checks Blood Sugar _____ times a day  Blood Thinner Instructions:NA Aspirin Instructions: Last Dose:  Anesthesia review: no  Patient denies shortness of breath, fever, cough and chest pain at PAT appointment Pt has no SOB with activities. He was not happy to have her nail polish taken off.  Her husband was with her at the PAT visit. Her BP was 165/103 and 164/99. I told her to monitor her BP and call her PCP if it remains high. The surgery could be cancelled if BP is too high. Patient verbalized understanding of instructions that were given to them at the PAT appointment. Patient was also instructed that they will need to review over the PAT instructions again at home before surgery. Yes There were no orders in Epic for the PAT visit. She was told no food after midnight and clear liquids until 9:30 am.

## 2021-05-31 ENCOUNTER — Other Ambulatory Visit: Payer: Self-pay | Admitting: Family

## 2021-06-04 ENCOUNTER — Encounter (HOSPITAL_COMMUNITY)
Admission: RE | Admit: 2021-06-04 | Discharge: 2021-06-04 | Disposition: A | Discharge status: Home / Self Care | Payer: Medicare Other | Source: Ambulatory Visit | Attending: Surgery | Admitting: Surgery

## 2021-06-04 ENCOUNTER — Other Ambulatory Visit: Payer: Self-pay

## 2021-06-04 ENCOUNTER — Other Ambulatory Visit: Payer: Self-pay | Admitting: Family

## 2021-06-04 DIAGNOSIS — Z20822 Contact with and (suspected) exposure to covid-19: Secondary | ICD-10-CM | POA: Diagnosis not present

## 2021-06-04 DIAGNOSIS — Z01818 Encounter for other preprocedural examination: Secondary | ICD-10-CM

## 2021-06-04 DIAGNOSIS — Z01812 Encounter for preprocedural laboratory examination: Secondary | ICD-10-CM | POA: Diagnosis present

## 2021-06-04 LAB — SARS CORONAVIRUS 2 (TAT 6-24 HRS): SARS Coronavirus 2: NEGATIVE

## 2021-06-06 ENCOUNTER — Other Ambulatory Visit: Payer: Self-pay

## 2021-06-06 ENCOUNTER — Encounter (HOSPITAL_COMMUNITY): Payer: Self-pay | Admitting: Surgery

## 2021-06-06 ENCOUNTER — Encounter (HOSPITAL_COMMUNITY)
Admission: RE | Disposition: A | Discharge status: Home / Self Care | Payer: Self-pay | Source: Ambulatory Visit | Attending: Surgery

## 2021-06-06 ENCOUNTER — Ambulatory Visit (HOSPITAL_COMMUNITY): Payer: Medicare Other | Admitting: Anesthesiology

## 2021-06-06 ENCOUNTER — Observation Stay (HOSPITAL_COMMUNITY)
Admission: RE | Admit: 2021-06-06 | Discharge: 2021-06-09 | Disposition: A | Discharge status: Home / Self Care | Payer: Medicare Other | Source: Ambulatory Visit | Attending: Surgery | Admitting: Surgery

## 2021-06-06 ENCOUNTER — Ambulatory Visit (HOSPITAL_BASED_OUTPATIENT_CLINIC_OR_DEPARTMENT_OTHER): Payer: Medicare Other | Admitting: Anesthesiology

## 2021-06-06 DIAGNOSIS — Z79899 Other long term (current) drug therapy: Secondary | ICD-10-CM | POA: Insufficient documentation

## 2021-06-06 DIAGNOSIS — K432 Incisional hernia without obstruction or gangrene: Secondary | ICD-10-CM

## 2021-06-06 DIAGNOSIS — Z90710 Acquired absence of both cervix and uterus: Secondary | ICD-10-CM | POA: Insufficient documentation

## 2021-06-06 DIAGNOSIS — Z87891 Personal history of nicotine dependence: Secondary | ICD-10-CM | POA: Diagnosis not present

## 2021-06-06 DIAGNOSIS — I10 Essential (primary) hypertension: Secondary | ICD-10-CM | POA: Diagnosis not present

## 2021-06-06 DIAGNOSIS — E538 Deficiency of other specified B group vitamins: Secondary | ICD-10-CM

## 2021-06-06 HISTORY — PX: XI ROBOTIC ASSISTED VENTRAL HERNIA: SHX6789

## 2021-06-06 LAB — CREATININE, SERUM
Creatinine, Ser: 0.88 mg/dL (ref 0.44–1.00)
GFR, Estimated: >60 mL/min (ref >60)

## 2021-06-06 LAB — CBC
HCT: 46 % (ref 36.0–46.0)
Hemoglobin: 15.4 g/dL — ABNORMAL HIGH (ref 12.0–15.0)
MCH: 30.3 pg (ref 26.0–34.0)
MCHC: 33.5 g/dL (ref 30.0–36.0)
MCV: 90.4 fL (ref 80.0–100.0)
Platelets: 357 10*3/uL (ref 150–400)
RBC: 5.09 MIL/uL (ref 3.87–5.11)
RDW: 14.5 % (ref 11.5–15.5)
WBC: 15.5 10*3/uL — ABNORMAL HIGH (ref 4.0–10.5)
nRBC: 0 % (ref 0.0–0.2)

## 2021-06-06 SURGERY — REPAIR, HERNIA, VENTRAL, ROBOT-ASSISTED
Anesthesia: General

## 2021-06-06 MED ORDER — SIMETHICONE 80 MG PO CHEW: 80.0000 mg | CHEWABLE_TABLET | Freq: Four times a day (QID) | ORAL | Status: DC | PRN

## 2021-06-06 MED ORDER — BUPIVACAINE-EPINEPHRINE 0.25% -1:200000 IJ SOLN
INTRAMUSCULAR | Status: DC | PRN
  Administered 2021-06-06: 30 mL

## 2021-06-06 MED ORDER — ENOXAPARIN SODIUM 40 MG/0.4ML IJ SOSY
40.0000 mg | PREFILLED_SYRINGE | INTRAMUSCULAR | Status: DC
  Administered 2021-06-07 – 2021-06-09 (×3): 40 mg via SUBCUTANEOUS
  Filled 2021-06-06 (×3): qty 0.4

## 2021-06-06 MED ORDER — ROCURONIUM BROMIDE 10 MG/ML (PF) SYRINGE
PREFILLED_SYRINGE | INTRAVENOUS | Status: AC
  Filled 2021-06-06: qty 10

## 2021-06-06 MED ORDER — OXYCODONE HCL 5 MG PO TABS: 5.0000 mg | ORAL_TABLET | ORAL | Status: DC | PRN

## 2021-06-06 MED ORDER — SUGAMMADEX SODIUM 200 MG/2ML IV SOLN
INTRAVENOUS | Status: DC | PRN
Start: 2021-06-06 — End: 2021-06-06
  Administered 2021-06-06: 200 mg via INTRAVENOUS

## 2021-06-06 MED ORDER — ORAL CARE MOUTH RINSE: 15.0000 mL | Freq: Once | OROMUCOSAL | Status: AC

## 2021-06-06 MED ORDER — DOCUSATE SODIUM 100 MG PO CAPS
100.0000 mg | ORAL_CAPSULE | Freq: Two times a day (BID) | ORAL | Status: DC
  Administered 2021-06-06 – 2021-06-09 (×5): 100 mg via ORAL
  Filled 2021-06-06 (×6): qty 1

## 2021-06-06 MED ORDER — LIDOCAINE HCL (PF) 2 % IJ SOLN
INTRAMUSCULAR | Status: AC
  Filled 2021-06-06: qty 5

## 2021-06-06 MED ORDER — HYDRALAZINE HCL 20 MG/ML IJ SOLN
INTRAMUSCULAR | Status: AC
  Filled 2021-06-06: qty 1

## 2021-06-06 MED ORDER — BUPIVACAINE LIPOSOME 1.3 % IJ SUSP
INTRAMUSCULAR | Status: DC | PRN
  Administered 2021-06-06: 20 mL

## 2021-06-06 MED ORDER — HYDROMORPHONE HCL 1 MG/ML IJ SOLN: 0.5000 mg | INTRAMUSCULAR | Status: DC | PRN

## 2021-06-06 MED ORDER — OXYCODONE HCL 5 MG/5ML PO SOLN: 5.0000 mg | Freq: Once | ORAL | Status: DC | PRN

## 2021-06-06 MED ORDER — LISINOPRIL 20 MG PO TABS
20.0000 mg | ORAL_TABLET | Freq: Every day | ORAL | Status: DC
Start: 2021-06-06 — End: 2021-06-09
  Administered 2021-06-06 – 2021-06-09 (×4): 20 mg via ORAL
  Filled 2021-06-06 (×4): qty 1

## 2021-06-06 MED ORDER — PROPOFOL 10 MG/ML IV BOLUS
INTRAVENOUS | Status: DC | PRN
  Administered 2021-06-06: 150 mg via INTRAVENOUS

## 2021-06-06 MED ORDER — LACTATED RINGERS IV SOLN: INTRAVENOUS | Status: DC

## 2021-06-06 MED ORDER — ROCURONIUM BROMIDE 10 MG/ML (PF) SYRINGE
PREFILLED_SYRINGE | INTRAVENOUS | Status: DC | PRN
  Administered 2021-06-06: 100 mg via INTRAVENOUS

## 2021-06-06 MED ORDER — LORATADINE 10 MG PO TABS: 10.0000 mg | ORAL_TABLET | Freq: Every day | ORAL | Status: DC

## 2021-06-06 MED ORDER — CEFAZOLIN SODIUM-DEXTROSE 2-4 GM/100ML-% IV SOLN
INTRAVENOUS | Status: AC
  Filled 2021-06-06: qty 100

## 2021-06-06 MED ORDER — HYDRALAZINE HCL 20 MG/ML IJ SOLN
INTRAMUSCULAR | Status: DC | PRN
  Administered 2021-06-06: 5 mg via INTRAVENOUS

## 2021-06-06 MED ORDER — PROPOFOL 10 MG/ML IV BOLUS
INTRAVENOUS | Status: AC
  Filled 2021-06-06: qty 20

## 2021-06-06 MED ORDER — CELECOXIB 100 MG PO CAPS
100.0000 mg | ORAL_CAPSULE | Freq: Every day | ORAL | Status: DC
Start: 2021-06-06 — End: 2021-06-07
  Administered 2021-06-06: 100 mg via ORAL
  Filled 2021-06-06 (×2): qty 1

## 2021-06-06 MED ORDER — 0.9 % SODIUM CHLORIDE (POUR BTL) OPTIME
TOPICAL | Status: DC | PRN
  Administered 2021-06-06: 1000 mL

## 2021-06-06 MED ORDER — ACETAMINOPHEN 325 MG PO TABS
650.0000 mg | ORAL_TABLET | Freq: Four times a day (QID) | ORAL | Status: DC
  Administered 2021-06-06 – 2021-06-09 (×10): 650 mg via ORAL
  Filled 2021-06-06 (×11): qty 2

## 2021-06-06 MED ORDER — OXYCODONE HCL 5 MG PO TABS: 5.0000 mg | ORAL_TABLET | Freq: Once | ORAL | Status: DC | PRN

## 2021-06-06 MED ORDER — OXYCODONE-ACETAMINOPHEN 5-325 MG PO TABS
1.0000 | ORAL_TABLET | ORAL | 0 refills | Status: DC | PRN
Start: 2021-06-06 — End: 2021-12-02

## 2021-06-06 MED ORDER — MECLIZINE HCL 25 MG PO TABS: 25.0000 mg | ORAL_TABLET | Freq: Three times a day (TID) | ORAL | Status: DC | PRN

## 2021-06-06 MED ORDER — PHENYLEPHRINE HCL-NACL 20-0.9 MG/250ML-% IV SOLN
INTRAVENOUS | Status: AC
  Filled 2021-06-06: qty 250

## 2021-06-06 MED ORDER — HYDROMORPHONE HCL 1 MG/ML IJ SOLN
INTRAMUSCULAR | Status: AC
  Filled 2021-06-06: qty 2

## 2021-06-06 MED ORDER — HYDROMORPHONE HCL 1 MG/ML IJ SOLN
0.2500 mg | INTRAMUSCULAR | Status: DC | PRN
  Administered 2021-06-06 (×3): 0.5 mg via INTRAVENOUS

## 2021-06-06 MED ORDER — CHLORHEXIDINE GLUCONATE 0.12 % MT SOLN
15.0000 mL | Freq: Once | OROMUCOSAL | Status: AC
  Administered 2021-06-06: 15 mL via OROMUCOSAL

## 2021-06-06 MED ORDER — DEXAMETHASONE SODIUM PHOSPHATE 10 MG/ML IJ SOLN
INTRAMUSCULAR | Status: DC | PRN
Start: 2021-06-06 — End: 2021-06-06
  Administered 2021-06-06: 4 mg via INTRAVENOUS

## 2021-06-06 MED ORDER — HYDROCHLOROTHIAZIDE 12.5 MG PO TABS
12.5000 mg | ORAL_TABLET | Freq: Every day | ORAL | Status: DC
  Administered 2021-06-06 – 2021-06-09 (×4): 12.5 mg via ORAL
  Filled 2021-06-06 (×4): qty 1

## 2021-06-06 MED ORDER — ACETAMINOPHEN 500 MG PO TABS
1000.0000 mg | ORAL_TABLET | Freq: Once | ORAL | Status: AC
  Administered 2021-06-06: 1000 mg via ORAL
  Filled 2021-06-06: qty 2

## 2021-06-06 MED ORDER — FENTANYL CITRATE (PF) 100 MCG/2ML IJ SOLN
INTRAMUSCULAR | Status: DC | PRN
  Administered 2021-06-06 (×4): 50 ug via INTRAVENOUS

## 2021-06-06 MED ORDER — LIDOCAINE 2% (20 MG/ML) 5 ML SYRINGE
INTRAMUSCULAR | Status: DC | PRN
  Administered 2021-06-06: 60 mg via INTRAVENOUS

## 2021-06-06 MED ORDER — CEFAZOLIN SODIUM-DEXTROSE 2-3 GM-%(50ML) IV SOLR
INTRAVENOUS | Status: DC | PRN
  Administered 2021-06-06: 2 g via INTRAVENOUS

## 2021-06-06 MED ORDER — HYDRALAZINE HCL 20 MG/ML IJ SOLN
5.0000 mg | INTRAMUSCULAR | Status: DC | PRN
  Administered 2021-06-06 (×2): 5 mg via INTRAVENOUS

## 2021-06-06 MED ORDER — PROCHLORPERAZINE EDISYLATE 10 MG/2ML IJ SOLN
10.0000 mg | INTRAMUSCULAR | Status: DC | PRN
  Administered 2021-06-07: 10 mg via INTRAVENOUS
  Filled 2021-06-06: qty 2

## 2021-06-06 MED ORDER — PHENYLEPHRINE HCL-NACL 20-0.9 MG/250ML-% IV SOLN
INTRAVENOUS | Status: DC | PRN
  Administered 2021-06-06: 15 ug/min via INTRAVENOUS

## 2021-06-06 MED ORDER — PANTOPRAZOLE SODIUM 40 MG PO TBEC
40.0000 mg | DELAYED_RELEASE_TABLET | Freq: Every day | ORAL | Status: DC
  Administered 2021-06-07 – 2021-06-09 (×3): 40 mg via ORAL
  Filled 2021-06-06 (×3): qty 1

## 2021-06-06 MED ORDER — FELODIPINE ER 5 MG PO TB24
10.0000 mg | ORAL_TABLET | Freq: Every day | ORAL | Status: DC
Start: 2021-06-07 — End: 2021-06-09
  Administered 2021-06-07 – 2021-06-09 (×3): 10 mg via ORAL
  Filled 2021-06-06 (×3): qty 2
  Filled 2021-06-06: qty 1

## 2021-06-06 MED ORDER — METHOCARBAMOL 1000 MG/10ML IJ SOLN
500.0000 mg | Freq: Four times a day (QID) | INTRAMUSCULAR | Status: DC | PRN
Start: 2021-06-06 — End: 2021-06-09
  Administered 2021-06-06: 500 mg via INTRAVENOUS
  Filled 2021-06-06 (×2): qty 5
  Filled 2021-06-06: qty 500

## 2021-06-06 MED ORDER — ONDANSETRON HCL 4 MG/2ML IJ SOLN
4.0000 mg | Freq: Four times a day (QID) | INTRAMUSCULAR | Status: DC | PRN
Start: 2021-06-06 — End: 2021-06-09
  Administered 2021-06-07: 4 mg via INTRAVENOUS
  Filled 2021-06-06: qty 2

## 2021-06-06 MED ORDER — OXYCODONE HCL 5 MG PO TABS
10.0000 mg | ORAL_TABLET | ORAL | Status: DC | PRN
  Administered 2021-06-06 – 2021-06-07 (×2): 10 mg via ORAL
  Filled 2021-06-06 (×3): qty 2

## 2021-06-06 MED ORDER — ONDANSETRON HCL 4 MG/2ML IJ SOLN
INTRAMUSCULAR | Status: DC | PRN
  Administered 2021-06-06: 4 mg via INTRAVENOUS

## 2021-06-06 MED ORDER — ONDANSETRON HCL 4 MG/2ML IJ SOLN: 4.0000 mg | Freq: Once | INTRAMUSCULAR | Status: DC | PRN

## 2021-06-06 MED ORDER — ONDANSETRON HCL 4 MG/2ML IJ SOLN
INTRAMUSCULAR | Status: AC
  Filled 2021-06-06: qty 2

## 2021-06-06 MED ORDER — FENTANYL CITRATE (PF) 100 MCG/2ML IJ SOLN
INTRAMUSCULAR | Status: AC
  Filled 2021-06-06: qty 2

## 2021-06-06 SURGICAL SUPPLY — 61 items
BAG COUNTER SPONGE SURGICOUNT (BAG) ×2 IMPLANT
BLADE SURG SZ11 CARB STEEL (BLADE) ×2 IMPLANT
CHLORAPREP W/TINT 26 (MISCELLANEOUS) ×2 IMPLANT
COVER TIP SHEARS 8 DVNC (MISCELLANEOUS) ×1 IMPLANT
COVER TIP SHEARS 8MM DA VINCI (MISCELLANEOUS) ×1
DERMABOND ADVANCED (GAUZE/BANDAGES/DRESSINGS) ×1
DERMABOND ADVANCED .7 DNX12 (GAUZE/BANDAGES/DRESSINGS) IMPLANT
DEVICE TROCAR PUNCTURE CLOSURE (ENDOMECHANICALS) IMPLANT
DRAPE ARM DVNC X/XI (DISPOSABLE) ×4 IMPLANT
DRAPE COLUMN DVNC XI (DISPOSABLE) ×1 IMPLANT
DRAPE DA VINCI XI ARM (DISPOSABLE) ×4
DRAPE DA VINCI XI COLUMN (DISPOSABLE) ×1
ELECT L-HOOK LAP 45CM DISP (ELECTROSURGICAL) ×4
ELECT PENCIL ROCKER SW 15FT (MISCELLANEOUS) ×3 IMPLANT
ELECT REM PT RETURN 15FT ADLT (MISCELLANEOUS) ×2 IMPLANT
ELECTRODE L-HOOK LAP 45CM DISP (ELECTROSURGICAL) ×1 IMPLANT
GLOVE SRG 8 PF TXTR STRL LF DI (GLOVE) ×2 IMPLANT
GLOVE SURG ENC MOIS LTX SZ7.5 (GLOVE) ×4 IMPLANT
GLOVE SURG UNDER POLY LF SZ8 (GLOVE) ×2
GOWN STRL REUS W/TWL XL LVL3 (GOWN DISPOSABLE) ×6 IMPLANT
GRASPER SUT TROCAR 14GX15 (MISCELLANEOUS) IMPLANT
IRRIG SUCT STRYKERFLOW 2 WTIP (MISCELLANEOUS)
IRRIGATION SUCT STRKRFLW 2 WTP (MISCELLANEOUS) IMPLANT
KIT BASIN OR (CUSTOM PROCEDURE TRAY) ×2 IMPLANT
KIT TURNOVER KIT A (KITS) IMPLANT
MANIFOLD NEPTUNE II (INSTRUMENTS) ×2 IMPLANT
MESH SOFT 12X12IN BARD (Mesh General) ×1 IMPLANT
NDL SPNL 18GX3.5 QUINCKE PK (NEEDLE) ×1 IMPLANT
NEEDLE SPNL 18GX3.5 QUINCKE PK (NEEDLE) ×2 IMPLANT
PACK CARDIOVASCULAR III (CUSTOM PROCEDURE TRAY) ×2 IMPLANT
SEAL CANN UNIV 5-8 DVNC XI (MISCELLANEOUS) ×3 IMPLANT
SEAL XI 5MM-8MM UNIVERSAL (MISCELLANEOUS) ×3
SEALER VESSEL DA VINCI XI (MISCELLANEOUS)
SEALER VESSEL EXT DVNC XI (MISCELLANEOUS) IMPLANT
SOL ANTI FOG 6CC (MISCELLANEOUS) ×1 IMPLANT
SOLUTION ANTI FOG 6CC (MISCELLANEOUS) ×1
SOLUTION ELECTROLUBE (MISCELLANEOUS) ×2 IMPLANT
SPIKE FLUID TRANSFER (MISCELLANEOUS) ×2 IMPLANT
SPONGE T-LAP 18X18 ~~LOC~~+RFID (SPONGE) ×2 IMPLANT
SUT MNCRL AB 4-0 PS2 18 (SUTURE) ×2 IMPLANT
SUT STRAFIX PDS 18 CTX (SUTURE) ×2 IMPLANT
SUT STRAFIX SPIRAL 2-0 3 (SUTURE) IMPLANT
SUT STRAFIX SPIRAL 2-0 5 (SUTURE) IMPLANT
SUT STRAFIX SPIRAL 2-0 9 (SUTURE) IMPLANT
SUT STRAFIX SYMMETRIC 0-0 12 (SUTURE)
SUT STRAFIX SYMMETRIC 0-0 18 (SUTURE)
SUT STRAFIX SYMMETRIC 0-0 24 (SUTURE)
SUT STRAFIX SYMMETRIC 1-0 12 (SUTURE)
SUT STRAFIX SYMMETRIC 1-0 24 (SUTURE)
SUTURE STRAFIX SYMMETRC 0-0 12 (SUTURE) IMPLANT
SUTURE STRAFIX SYMMETRC 0-0 18 (SUTURE) IMPLANT
SUTURE STRAFIX SYMMETRC 0-0 24 (SUTURE) IMPLANT
SUTURE STRAFIX SYMMETRC 1-0 12 (SUTURE) IMPLANT
SUTURE STRAFIX SYMMETRC 1-0 24 (SUTURE) IMPLANT
SYR 20ML LL LF (SYRINGE) ×2 IMPLANT
TOWEL OR 17X26 10 PK STRL BLUE (TOWEL DISPOSABLE) ×2 IMPLANT
TOWEL OR NON WOVEN STRL DISP B (DISPOSABLE) IMPLANT
TROCAR BLADELESS OPT 5 100 (ENDOMECHANICALS) IMPLANT
TROCAR XCEL 12X100 BLDLESS (ENDOMECHANICALS) ×2 IMPLANT
TROCAR Z-THREAD FIOS 5X100MM (TROCAR) ×2 IMPLANT
TUBING INSUFFLATION 10FT LAP (TUBING) ×2 IMPLANT

## 2021-06-06 NOTE — Anesthesia Procedure Notes (Signed)
Procedure Name: Intubation Date/Time: 06/06/2021 3:21 PM Performed by: Niel Hummer, CRNA Pre-anesthesia Checklist: Patient identified, Emergency Drugs available, Suction available and Patient being monitored Patient Re-evaluated:Patient Re-evaluated prior to induction Oxygen Delivery Method: Circle system utilized Preoxygenation: Pre-oxygenation with 100% oxygen Induction Type: IV induction Ventilation: Mask ventilation without difficulty Laryngoscope Size: Mac and 4 Grade View: Grade I Tube type: Oral Tube size: 7.5 mm Number of attempts: 1 Airway Equipment and Method: Stylet Placement Confirmation: ETT inserted through vocal cords under direct vision, positive ETCO2 and breath sounds checked- equal and bilateral Secured at: 23 cm Tube secured with: Tape Dental Injury: Teeth and Oropharynx as per pre-operative assessment

## 2021-06-06 NOTE — Op Note (Signed)
Patient: Kathryn Pruitt (01/20/1946, [id number])  Date of Surgery: 06/06/2021   Preoperative Diagnosis: INCISIONAL HERNIA   Postoperative Diagnosis: INCISIONAL HERNIA 15 cm tall x 4 cm wide  Surgical Procedure: ROBOTIC INCISIONAL HERNIA REPAIR WITH MESH:   Bilateral posterior rectus sheath myofascial release Insertion of retromuscular mesh  Operative Team Members:  Surgeon(s) and Role:    * Yaneth Fairbairn, Nickola Major, MD - Primary   Anesthesiologist: Pervis Hocking, DO CRNA: Niel Hummer, CRNA   Anesthesia: General   Fluids:  Total I/O In: 1000 [I.V.:1000] Out: 10 [XTKWI:09]  Complications: None  Drains:  None  Specimen: None   Disposition:  PACU - hemodynamically stable.  Plan of Care: Admit for overnight observation  Indications for Procedure: Kathryn Pruitt is a 76 y.o. female who presented with incisional hernia.  Kathryn Pruitt has multiple incisional hernias under previous midline laparotomy incision from a hysterectomy. I recommended robotic, possibly open incisional hernia repair with mesh. The procedure itself as well as its risk, benefits, and alternatives with discussed with the patient. After full discussion all questions answered the patient granted consent to proceed.  Findings:  Hernia Location: Ventral hernia location: Epigastric (M2), Umbilical (M3), Infraumbilical (M4), and Suprapubic (M5) Hernia Size:  15 cm tall x 4 cm wide Mesh Size &Type: 29 cm tall x 20 cm wide  Bard Soft Mesh Mesh Position: Sublay - Retromuscular Myofascial Releases: Bilateral Myofascial Release: Posterior rectus myofascial release   Description of Procedure: The patient was positioned supine, moderately flexed at the umbilical level, padded and secured on the operating table.  A timeout procedure was performed.    What is described is a robotic, totally extraperitoneal retromuscular incisional hernia repair with bilateral rectus myofascial release and retromuscular  mesh placement.  Laparoscopic Portion: The retrorectus space was entered in the LEFT hypochondrium, at approximately the midclavicular line utilizing a 5 mm optical-viewing trocar.  Upon safe entry into this space, it was insufflated while performing a blunt dissection with the camera still in the optical trocar. A rectus myofascial release was performed on the LEFT side. Dissection was carried out laterally in the retromuscular plane to the edge of the rectus sheath progressively disconnecting the rectus muscle from the underlying posterior rectus sheath. Both the segmental innervation as well as the intercostal artery and vein brances to the rectus muscle were individually preserved.    During the left sided retrorectus dissection, a 12 mm trocar was placed into the lateral most edge of the retrorectus space.  With these initial trocars in position, the medial most aspect of the retrorectus plane was identified, and the posterior sheath was visualized as it inserted on the linea alba. The posterior sheath was incised with cautery entering the preperitoneal plane. A crossover was performed dissecting under the linea alba in the preperitoneal plane until the right rectus sheath was identified.  After identification of the right rectus sheath, it was incised vertically to enter the retrorectus space on the right. A rectus myofascial release was performed on the RIGHT side.  Blunt dissection was carried out laterally in the retromuscular plane to the edge of the rectus sheath progressively disconnecting the rectus muscle from the underlying posterior rectus sheath. Both the segmental innervation as well as the intercostal artery and vein brances to the rectus muscle were individually preserved.   At this juncture, both retrorectus planes were initially connected to each other and there was space for further trocar placement. An 8 mm robotic trocar was placed in the midclavicular  line in right retrorectus  space.  A 51mm robotic trocar was placed within the left rectus musculature in the upper abdomen, and not through the linea alba.  The initial 5 mm access trocar in the midclavicular line within the left retrorectus space was switched out for an 8 mm robotic trocar.   Robotic Portion: The Intuitive daVinci Xi surgical robot was docked in the standard fashion and the procedure begun from the robotic console. A Prograsp instrument and monopolar shears were used for the dissection.  Dissection was carried down inferiorly preserving the peritoneum and the preperitoneal fat in the midline as it was gently dissected off of the overlying linea alba.  On the right side, the posterior rectus sheath was progressively disconnected from its insertion on the linea alba. This allowed for progression of the right side rectus myofascial release.  The rectus myofascial release accomplished medialization of the posterior rectus sheath towards the midline and disinsertion of the rectus muscle from its surrounding fascia, and thus its encasement in the rectus sheath, allowing for widening of the rectus muscle and transfer of the rectus flap towards the midline.  This will allow for future inset of the medial aspect of the flap for abdominal wall reconstruction.  Similarly, on the left side, the posterior rectus sheath was also progressively disconnected from its insertion on the linea alba.  This allowed for progression of the left side rectus myofascial release.  The rectus myofascial release accomplished medialization of the posterior rectus sheath towards the midline and disinsertion of the rectus muscle from its surrounding fascia, and thus its encasement in the rectus sheath, allowing for widening of the rectus muscle and transfer of the rectus flap towards the midline.  This will allow for future inset of the medial aspect of the flap for abdominal wall reconstruction.  During the dissection of the midline the hernia  defect was identified and the hernia sac was reduced to preserve integrity of the visceral sac.  Both the left and the right rectus myofascial releases were performed towards the lower abdomen, past the arcuate line bilaterally.  During this dissection, the peritoneum and preperitoneal fat in the midline were further preserved below the hernia as they were dissected off of the overlying linea alba.   The hernia defect area was now visualized fully.  The hernia defects were located in the Epigastric (M2), Umbilical (M3), Infraumbilical (M4), and Suprapubic (M5) regions. Utilizing a metric ruler, the defect are was measured intracorporeally to be 4 cm horizontal by 15 cm vertical.  The hernia defect was closed utilizing a continuous, #1 Ethicon Stratafix Symmetric PDS Plus suture.  The hernia defect, and subsequently the rectus musculature, came together well for a complete abdominal wall reconstruction.  The dissected out retrorectus space was measured with a metric ruler so as to determine the size of the proposed mesh.    The robot was undocked and the laparoscope was inserted, inspecting for hemostasis.  The mesh deployment was performed laparoscopically.  Laparoscopic Portion:  A transversus abdominis plane (TAP) block was performed bilaterally with a mixture of marcaine and Exparel.  The anesthetic was first injected into the plane between the transversus abdominis and internal abdominal oblique muscles on the left. The TAP was repeated on the contralateral side.   A piece of Bard Soft was opened and trimmed to 29 cm tall x 20 cm wide. The mesh was advanced into the retrorectus space and the mesh positioned flat against the intact posterior rectus sheaths. The  mesh was not fixated as it occupied the entire retromuscular plane, and also covered all of the trocars.  The trocars were removed and the skin closed with 4-0 Monocryl subcuticular sutures and skin glue.   Louanna Raw, MD General,  Bariatric, & Minimally Invasive Surgery [facility], [place]

## 2021-06-06 NOTE — Discharge Summary (Signed)
°  Patient ID: Kathryn Pruitt [id number] 75 y.o. [birth year]  06/06/2021  Discharge date and time: 06/09/2021  Admitting Physician: Kathryn Pruitt  Admission Diagnoses: Incisional hernia [K43.2] Patient Active Problem List   Diagnosis Date Noted   Incisional hernia 06/06/2021   Abnormal mammogram see [facility] mm stable left breast mass since 2013 felt to be benign  08/13/2014   Osteopenia  Dexa done 07/20/2014  [facility] 08/06/2014   DJD (degenerative joint disease) 02/06/2014   HTN (hypertension) 02/06/2014   Tobacco use disorder  quit 20 years ago  02/06/2014   Marijuana use, episodic 02/06/2014   S/P hysterectomy with oophorectomy  has right ovary  02/06/2014   GERD (gastroesophageal reflux disease) 02/06/2014     Discharge Diagnoses: Incisional hernia Patient Active Problem List   Diagnosis Date Noted   Incisional hernia 06/06/2021   Abnormal mammogram see [facility] mm stable left breast mass since 2013 felt to be benign  08/13/2014   Osteopenia  Dexa done 07/20/2014  [facility] 08/06/2014   DJD (degenerative joint disease) 02/06/2014   HTN (hypertension) 02/06/2014   Tobacco use disorder  quit 20 years ago  02/06/2014   Marijuana use, episodic 02/06/2014   S/P hysterectomy with oophorectomy  has right ovary  02/06/2014   GERD (gastroesophageal reflux disease) 02/06/2014    Operations: Procedure(s): ROBOTIC INCISIONAL HERNIA REPAIR WITH MESH  Admission Condition: good  Discharged Condition: good  Indication for Admission: Incisional hernia  Hospital Course: Kathryn Pruitt presented for robotic incisional hernia repair. She recovered in the hospital and was discharged.  Consults: None  Significant Diagnostic Studies: None  Treatments: surgery: as above  Disposition: Home  Patient Instructions:  Allergies as of 06/09/2021       Reactions   Prednisone Hives        Medication List     TAKE these medications    amoxicillin 500 MG  tablet Commonly known as: AMOXIL Take 2,000 mg by mouth once. ONLY WHEN GOING TO THE DENTIST   celecoxib 100 MG capsule Commonly known as: CeleBREX Take one daily   felodipine 10 MG 24 hr tablet Commonly known as: PLENDIL Take 1 tablet (10 mg total) by mouth daily.   fosinopril 20 MG tablet Commonly known as: MONOPRIL TAKE ONE TABLET BY MOUTH DAILY   hydrochlorothiazide 12.5 MG capsule Commonly known as: MICROZIDE TAKE ONE CAPSULE BY MOUTH DAILY   loratadine 10 MG tablet Commonly known as: CLARITIN Take 10 mg by mouth daily.   meclizine 25 MG tablet Commonly known as: ANTIVERT Take 1 tablet (25 mg total) by mouth 3 (three) times daily as needed for dizziness.   oxyCODONE-acetaminophen 5-325 MG tablet Commonly known as: Percocet Take 1 tablet by mouth every 4 (four) hours as needed for severe pain.   pantoprazole 40 MG tablet Commonly known as: PROTONIX TAKE ONE TABLET BY MOUTH DAILY   VITAMIN D3 PO Take 1 tablet by mouth daily.        Activity: no heavy lifting for 4 weeks Diet: regular diet Wound Care: keep wound clean and dry  Follow-up:  With Dr. Thermon Pruitt in 4 weeks.  Signed: Nickola Major Martine Pruitt General, Bariatric, & Minimally Invasive Surgery [facility], [place]   06/09/2021, 7:46 AM

## 2021-06-06 NOTE — Discharge Instructions (Signed)
 VENTRAL HERNIA REPAIR POST OPERATIVE INSTRUCTIONS  Thinking Clearly  The anesthesia may cause you to feel different for 1 or 2 days. Do not drive, drink alcohol, or make any big decisions for at least 2 days.  Nutrition When you wake up, you will be able to drink small amounts of liquid. If you do not feel sick, you can slowly advance your diet to regular foods. Continue to drink lots of fluids, usually about 8 to 10 glasses per day. Eat a high-fiber diet so you don't strain during bowel movements. High-Fiber Foods Foods high in fiber include beans, bran cereals and whole-grain breads, peas, dried fruit (figs, apricots, and dates), raspberries, blackberries, strawberries, sweet corn, broccoli, baked potatoes with skin, plums, pears, apples, greens, and nuts. Activity Slowly increase your activity. Be sure to get up and walk every hour or so to prevent blood clots. No heavy lifting or strenuous activity for 4 weeks following surgery to prevent hernias at your incision sites or recurrence of your hernia. It is normal to feel tired. You may need more sleep than usual.  Get your rest but make sure to get up and move around frequently to prevent blood clots and pneumonia.  Work and Return to School You can go back to work when you feel well enough. Discuss the timing with your surgeon. You can usually go back to school or work 1 week or less after an laparoscopic or an open repair. If your work requires heavy lifting or strenuous activity you need to be placed on light duty for 4 weeks following surgery. You can return to gym class, sports or other physical activities 4 weeks after surgery.  Wound Care You may experience significant bruising throughout the abdominal wall that may track down into the groin including into the scrotum in males.  Rest, elevating the groin and scrotum above the level of the heart, ice and compression with tight fitting underwear or an abdominal binder can help.   Always wash your hands before and after touching near your incision site. Do not soak in a bathtub until cleared at your follow up appointment. You may take a shower 24 hours after surgery. A small amount of drainage from the incision is normal. If the drainage is thick and yellow or the site is red, you may have an infection, so call your surgeon. If you have a drain in one of your incisions, it will be taken out in office when the drainage stops. Steri-Strips will fall off in 7 to 10 days or they will be removed during your first office visit. If you have dermabond glue covering over the incision, allow the glue to flake off on its own. Protect the new skin, especially from the sun. The sun can burn and cause darker scarring. Your scar will heal in about 4 to 6 weeks and will become softer and continue to fade over the next year.  The cosmetic appearance of the incisions will improve over the course of the first year after surgery. Sensation around your incision will return in a few weeks or months.  Bowel Movements After intestinal surgery, you may have loose watery stools for several days. If watery diarrhea lasts longer than 3 days, contact your surgeon. Pain medication (narcotics) can cause constipation. Increase the fiber in your diet with high-fiber foods if you are constipated. You can take an over the counter stool softener like Colace to avoid constipation.  Additional over the counter medications can also be used   if Colace isn't sufficient (for example, Milk of Magnesia or Miralax).  Pain The amount of pain is different for each person. Some people need only 1 to 3 doses of pain control medication, while others need more. Take alternating doses of tylenol and ibuprofen around the clock for the first five days following surgery.  This will provide a baseline of pain control and help with inflammation.  Take the narcotic pain medication in addition if needed for severe pain.  Contact  Your Surgeon at [phone], if you have: Pain that will not go away Pain that gets worse A fever of more than 101F (38.3C) Repeated vomiting Swelling, redness, bleeding, or bad-smelling drainage from your wound site Strong abdominal pain No bowel movement or unable to pass gas for 3 days Watery diarrhea lasting longer than 3 days  Pain Control The goal of pain control is to minimize pain, keep you moving and help you heal. Your surgical team will work with you on your pain plan. Most often a combination of therapies and medications are used to control your pain. You may also be given medication (local anesthetic) at the surgical site. This may help control your pain for several days. Extreme pain puts extra stress on your body at a time when your body needs to focus on healing. Do not wait until your pain has reached a level "10" or is unbearable before telling your doctor or nurse. It is much easier to control pain before it becomes severe. Following a laparoscopic procedure, pain is sometimes felt in the shoulder. This is due to the gas inserted into your abdomen during the procedure. Moving and walking helps to decrease the gas and the right shoulder pain.  Use the guide below for ways to manage your post-operative pain. Learn more by going to facs.org/safepaincontrol.  How Intense Is My Pain Common Therapies to Feel Better       I hardly notice my pain, and it does not interfere with my activities.  I notice my pain and it distracts me, but I can still do activities (sitting up, walking, standing).  Non-Medication Therapies  Ice (in a bag, applied over clothing at the surgical site), elevation, rest, meditation, massage, distraction (music, TV, play) walking and mild exercise Splinting the abdomen with pillows +  Non-Opioid Medications Acetaminophen (Tylenol) Non-steroidal anti-inflammatory drugs (NSAIDS) Aspirin, Ibuprofen (Motrin, Advil) Naproxen (Aleve) Take these as  needed, when you feel pain. Both acetaminophen and NSAIDs help to decrease pain and swelling (inflammation).      My pain is hard to ignore and is more noticeable even when I rest.  My pain interferes with my usual activities.  Non-Medication Therapies  +  Non-Opioid medications  Take on a regular schedule (around-the-clock) instead of as needed. (For example, Tylenol every 6 hours at 9:00 am, 3:00 pm, 9:00 pm, 3:00 am and Motrin every 6 hours at 12:00 am, 6:00 am, 12:00 pm, 6:00 pm)         I am focused on my pain, and I am not doing my daily activities.  I am groaning in pain, and I cannot sleep. I am unable to do anything.  My pain is as bad as it could be, and nothing else matters.  Non-Medication Therapies  +  Around-the-Clock Non-Opioid Medications  +  Short-acting opioids  Opioids should be used with other medications to manage severe pain. Opioids block pain and give a feeling of euphoria (feel high). Addiction, a serious side effect of opioids, is   rare with short-term (a few days) use.  Examples of short-acting opioids include: Tramadol (Ultram), Hydrocodone (Norco, Vicodin), Hydromorphone (Dilaudid), Oxycodone (Oxycontin)     The above directions have been adapted from the American College [employer] Surgical Patient Education Program.  Please refer to the ACS website if needed: [email or web address]://[email or web address].org/-/media/files/[email or web address]   Cristi Gwynn, MD [facility] [address], [address], [place], [place]  [address] ?  [address] [address], [place], [place]   [address] [phone] ? [phone] ? FAX [phone] Web site: [email or web address]  

## 2021-06-06 NOTE — Anesthesia Postprocedure Evaluation (Signed)
Anesthesia Post Note  Patient: Scientist, physiological  Procedure(s) Performed: ROBOTIC [place] WITH MESH     Patient location during evaluation: PACU Anesthesia Type: General Level of consciousness: awake and alert, oriented and patient cooperative Pain management: pain level controlled Vital Signs Assessment: post-procedure vital signs reviewed and stable Respiratory status: spontaneous breathing, nonlabored ventilation and respiratory function stable Cardiovascular status: blood pressure returned to baseline and stable Postop Assessment: no apparent nausea or vomiting Anesthetic complications: no   No notable events documented.  Last Vitals:  Vitals:   06/06/21 1815 06/06/21 1830  BP: (!) 179/95 (!) 179/96  Pulse: 99 98  Resp: (!) 22 17  Temp:  36.7 C  SpO2: 95% 96%    Last Pain:  Vitals:   06/06/21 1830  TempSrc:   PainSc: [place]

## 2021-06-06 NOTE — H&P (Signed)
Admitting Physician: Nickola Major Henlee Donovan  Service: General surgery  CC: Hernia  Subjective   HPI: Kathryn Pruitt is an 76 y.o. female who is here for elective robotic incisional hernia repair.  Past Medical History:  Diagnosis Date   Arthritis    Hypertension     Past Surgical History:  Procedure Laterality Date   ABDOMINAL HYSTERECTOMY  1987   FOOT SURGERY Bilateral 04/20/2000   TOTAL HIP ARTHROPLASTY Bilateral 04/20/2006   WISDOM TOOTH EXTRACTION  1986    Family History  Problem Relation Age of Onset   Lung cancer Mother    Hypertension Mother    Hypertension Brother    Bone cancer Maternal Grandmother     Social:  reports that she quit smoking about 25 years ago. Her smoking use included cigarettes. She has a 10.00 pack-year smoking history. She has never used smokeless tobacco. She reports that she does not currently use alcohol. She reports that she does not currently use drugs after having used the following drugs: Marijuana.  Allergies:  Allergies  Allergen Reactions   Prednisone Hives    Medications: Current Outpatient Medications  Medication Instructions   amoxicillin (AMOXIL) 2,000 mg, Oral,  Once   celecoxib (CELEBREX) 100 MG capsule Take one daily   Cholecalciferol (VITAMIN D3 PO) 1 tablet, Oral, Daily   felodipine (PLENDIL) 10 mg, Oral, Daily   fosinopril (MONOPRIL) 20 MG tablet TAKE ONE TABLET BY MOUTH DAILY   hydrochlorothiazide (MICROZIDE) 12.5 MG capsule TAKE ONE CAPSULE BY MOUTH DAILY   loratadine (CLARITIN) 10 mg, Oral, Daily   meclizine (ANTIVERT) 25 mg, Oral, 3 times daily PRN   pantoprazole (PROTONIX) 40 MG tablet TAKE ONE TABLET BY MOUTH DAILY    ROS - all of the below systems have been reviewed with the patient and positives are indicated with bold text General: chills, fever or night sweats Eyes: blurry vision or double vision ENT: epistaxis or sore throat Allergy/Immunology: itchy/watery eyes or nasal  congestion Hematologic/Lymphatic: bleeding problems, blood clots or swollen lymph nodes Endocrine: temperature intolerance or unexpected weight changes Breast: new or changing breast lumps or nipple discharge Resp: cough, shortness of breath, or wheezing CV: chest pain or dyspnea on exertion GI: as per HPI GU: dysuria, trouble voiding, or hematuria MSK: joint pain or joint stiffness Neuro: TIA or stroke symptoms Derm: pruritus and skin lesion changes Psych: anxiety and depression  Objective   PE There were no vitals taken for this visit. Constitutional: NAD; conversant; no deformities Eyes: Moist conjunctiva; no lid lag; anicteric; PERRL Neck: Trachea midline; no thyromegaly Lungs: Normal respiratory effort; no tactile fremitus CV: RRR; no palpable thrills; no pitting edema GI: Abd Soft, non-tender; no palpable hepatosplenomegaly MSK: Normal range of motion of extremities; no clubbing/cyanosis Psychiatric: Appropriate affect; alert and oriented x3 Lymphatic: No palpable cervical or axillary lymphadenopathy  No results found for this or any previous visit (from the past 24 hour(s)).  Imaging Orders  No imaging studies ordered today   CT Abd/Pel 01/23/21: No acute findings in the abdomen or pelvis. Small fat containing umbilical hernia. Hepatic steatosis. My interpretation: Multiple hernias noted on CT with diastasis located below her midline laparotomy hysterectomy incision. Superior-most hernia relates to the largest and most symptomatic. Hernias seem to span 7.6cm tall x 2cm wide area based on CT.  Assessment and Plan   [name] has multiple incisional hernias under previous midline laparotomy incision from a hysterectomy. I recommended robotic, possibly open incisional hernia repair with mesh. The procedure itself  as well as its risk, benefits, and alternatives with discussed with the patient. After full discussion all questions answered the patient granted consent to  proceed.   Felicie Morn, MD  [facility] Use [email or web address] to contact on call provider

## 2021-06-06 NOTE — Transfer of Care (Signed)
Immediate Anesthesia Transfer of Care Note  Patient: Scientist, physiological  Procedure(s) Performed: ROBOTIC INCISIONAL HERNIA REPAIR WITH MESH  Patient Location: PACU  Anesthesia Type:General  Level of Consciousness: awake, alert  and oriented  Airway & Oxygen Therapy: Patient Spontanous Breathing and Patient connected to face mask oxygen  Post-op Assessment: Report given to RN, Post -op Vital signs reviewed and stable and Patient moving all extremities X 4  Post vital signs: Reviewed and stable  Last Vitals:  Vitals Value Taken Time  BP 187/104   Temp    Pulse 90 06/06/21 1726  Resp 14 06/06/21 1726  SpO2 100 % 06/06/21 1726  Vitals shown include unvalidated device data.  Last Pain:  Vitals:   06/06/21 1317  TempSrc: Oral         Complications: No notable events documented.

## 2021-06-06 NOTE — Anesthesia Preprocedure Evaluation (Addendum)
Anesthesia Evaluation  Patient identified by MRN, date of birth, ID band Patient awake    Reviewed: Allergy & Precautions, NPO status , Patient's Chart, lab work & pertinent test results  Airway Mallampati: I  TM Distance: >3 FB Neck ROM: Full    Dental  (+) Teeth Intact, Dental Advisory Given   Pulmonary former smoker,  Former smoker, quit 1998, 10 pack year history   Pulmonary exam normal breath sounds clear to auscultation       Cardiovascular hypertension (150/88 in preop, per pt normally 130s SBP), Pt. on medications Normal cardiovascular exam Rhythm:Regular Rate:Normal     Neuro/Psych negative neurological ROS  negative psych ROS   GI/Hepatic GERD  Medicated and Controlled,(+)       marijuana use,   Endo/Other  Obesity BMI 33  Renal/GU negative Renal ROS  negative genitourinary   Musculoskeletal  (+) Arthritis , Osteoarthritis,    Abdominal   Peds  Hematology negative hematology ROS (+) hct 43.9   Anesthesia Other Findings Incisional hernia   Reproductive/Obstetrics negative OB ROS                            Anesthesia Physical Anesthesia Plan  ASA: 2  Anesthesia Plan: General   Post-op Pain Management: Tylenol PO (pre-op)*   Induction: Intravenous  PONV Risk Score and Plan: 4 or greater and Ondansetron, Dexamethasone and Treatment may vary due to age or medical condition  Airway Management Planned: Oral ETT  Additional Equipment: None  Intra-op Plan:   Post-operative Plan: Extubation in OR  Informed Consent: I have reviewed the patients History and Physical, chart, labs and discussed the procedure including the risks, benefits and alternatives for the proposed anesthesia with the patient or authorized representative who has indicated his/her understanding and acceptance.     Dental advisory given  Plan Discussed with: CRNA  Anesthesia Plan Comments:         Anesthesia Quick Evaluation

## 2021-06-07 DIAGNOSIS — K432 Incisional hernia without obstruction or gangrene: Secondary | ICD-10-CM | POA: Diagnosis not present

## 2021-06-07 MED ORDER — KETOROLAC TROMETHAMINE 15 MG/ML IJ SOLN
15.0000 mg | Freq: Three times a day (TID) | INTRAMUSCULAR | Status: DC
  Administered 2021-06-07 – 2021-06-08 (×3): 15 mg via INTRAVENOUS
  Filled 2021-06-07 (×3): qty 1

## 2021-06-07 NOTE — Progress Notes (Signed)
1 Day Post-Op Robotic hernia repair Subjective: Has a headache, mildly improved with tylenol.  No flatus.  Vomited this AM  Objective: Vital signs in last 24 hours: Temp:  [97.3 F (36.3 C)-99.1 F (37.3 C)] 98.4 F (36.9 C) (02/18 0759) Pulse Rate:  [66-99] 70 (02/18 0759) Resp:  [14-26] 20 (02/18 0759) BP: (134-187)/(79-108) 166/87 (02/18 0759) SpO2:  [94 %-100 %] 100 % (02/18 0759) Weight:  [87.5 kg] 87.5 kg (02/18 0458)   Intake/Output from previous day: 02/17 0701 - 02/18 0700 In: 1703.5 [P.O.:60; I.V.:1593.5; IV Piggyback:50] Out: 2110 [Urine:2100; Blood:10] Intake/Output this shift: No intake/output data recorded.   General appearance: alert and cooperative GI: soft, non-distended  Incision: no significant drainage  Lab Results:  Recent Labs    06/06/21 1916  WBC 15.5*  HGB 15.4*  HCT 46.0  PLT 357   BMET Recent Labs    06/06/21 1916  CREATININE 0.88   PT/INR No results for input(s): LABPROT, INR in the last 72 hours. ABG No results for input(s): PHART, HCO3 in the last 72 hours.  Invalid input(s): PCO2, PO2  MEDS, Scheduled  acetaminophen  650 mg Oral Q6H   docusate sodium  100 mg Oral BID   enoxaparin (LOVENOX) injection  40 mg Subcutaneous Q24H   felodipine  10 mg Oral Daily   hydrochlorothiazide  12.5 mg Oral Daily   ketorolac  15 mg Intravenous Q8H   lisinopril  20 mg Oral Daily   pantoprazole  40 mg Oral Daily    Studies/Results: No results found.  Assessment: s/p Procedure(s): ROBOTIC INCISIONAL HERNIA REPAIR WITH MESH Patient Active Problem List   Diagnosis Date Noted   Incisional hernia 06/06/2021   Abnormal mammogram see [facility] mm stable left breast mass since 2013 felt to be benign  08/13/2014   Osteopenia  Dexa done 07/20/2014  [facility] 08/06/2014   DJD (degenerative joint disease) 02/06/2014   HTN (hypertension) 02/06/2014   Tobacco use disorder  quit 20 years ago  02/06/2014   Marijuana use, episodic 02/06/2014   S/P  hysterectomy with oophorectomy  has right ovary  02/06/2014   GERD (gastroesophageal reflux disease) 02/06/2014      Plan: Advance diet slowly.  Advanced to fulls so the pt can have coffee.  Limited liquids until nausea improves Will add toradol and d/c celebrex until pt tolerating PO better Cont IVF's   LOS: 0 days     .Rosario Adie, [facility], [place]    06/07/2021 8:57 AM

## 2021-06-07 NOTE — Progress Notes (Signed)
Transition of Care (TOC) Screening Note  Patient Details  Name: Kathryn Pruitt Date of Birth: 03-11-1946  Transition of Care [facility]) CM/SW Contact:    Sherie Don, LCSW Phone Number: 06/07/2021, 9:03 AM  Transition of Care Department [facility]) has reviewed patient and no TOC needs have been identified at this time. We will continue to monitor patient advancement through interdisciplinary progression rounds. If new patient transition needs arise, please place a TOC consult.

## 2021-06-07 NOTE — Progress Notes (Signed)
°   06/07/21 1400  Mobility  Activity Ambulated with assistance in hallway  Level of Assistance Standby assist, set-up cues, supervision of patient - no hands on  Assistive Device Front wheel Joy  Distance Ambulated (ft) 80 ft  Activity Response Tolerated well  $Mobility charge 1 Mobility   Pt agreeable to mobilize this afternoon. She requested to use BSC prior to session. Ambulated about 9ft in hall with RW, tolerated well. She was shivering while up, but denied any complaints.  Left pt in bed, call bell at side. RN/NT notified of session.   [place] Specialist Acute Rehab Services Office: [phone]

## 2021-06-08 DIAGNOSIS — K432 Incisional hernia without obstruction or gangrene: Secondary | ICD-10-CM | POA: Diagnosis not present

## 2021-06-08 MED ORDER — CELECOXIB 100 MG PO CAPS
100.0000 mg | ORAL_CAPSULE | Freq: Two times a day (BID) | ORAL | Status: DC
Start: 2021-06-08 — End: 2021-06-09
  Administered 2021-06-08 – 2021-06-09 (×3): 100 mg via ORAL
  Filled 2021-06-08 (×3): qty 1

## 2021-06-08 NOTE — Progress Notes (Addendum)
2 Days Post-Op Robotic hernia repair Subjective: Having a small amount of flatus.  Still with some nausea, not much apettite.     Objective: Vital signs in last 24 hours: Temp:  [98.6 F (37 C)-99.5 F (37.5 C)] 98.6 F (37 C) (02/19 0542) Pulse Rate:  [65-81] 74 (02/19 0542) Resp:  [12-20] 18 (02/19 0542) BP: (140-178)/(83-104) 172/90 (02/19 0542) SpO2:  [96 %-98 %] 97 % (02/19 0542)   Intake/Output from previous day: 02/18 0701 - 02/19 0700 In: 2986.9 [P.O.:1140; I.V.:1846.9] Out: 200 [Urine:200] Intake/Output this shift: No intake/output data recorded.   General appearance: alert and cooperative GI: soft, non-distended  Incision: no significant drainage  Lab Results:  Recent Labs    06/06/21 1916  WBC 15.5*  HGB 15.4*  HCT 46.0  PLT 357    BMET Recent Labs    06/06/21 1916  CREATININE 0.88    PT/INR No results for input(s): LABPROT, INR in the last 72 hours. ABG No results for input(s): PHART, HCO3 in the last 72 hours.  Invalid input(s): PCO2, PO2  MEDS, Scheduled  acetaminophen  650 mg Oral Q6H   docusate sodium  100 mg Oral BID   enoxaparin (LOVENOX) injection  40 mg Subcutaneous Q24H   felodipine  10 mg Oral Daily   hydrochlorothiazide  12.5 mg Oral Daily   ketorolac  15 mg Intravenous Q8H   lisinopril  20 mg Oral Daily   pantoprazole  40 mg Oral Daily    Studies/Results: No results found.  Assessment: s/p Procedure(s): ROBOTIC INCISIONAL HERNIA REPAIR WITH MESH Patient Active Problem List   Diagnosis Date Noted   Incisional hernia 06/06/2021   Abnormal mammogram see [facility] mm stable left breast mass since 2013 felt to be benign  08/13/2014   Osteopenia  Dexa done 07/20/2014  [facility] 08/06/2014   DJD (degenerative joint disease) 02/06/2014   HTN (hypertension) 02/06/2014   Tobacco use disorder  quit 20 years ago  02/06/2014   Marijuana use, episodic 02/06/2014   S/P hysterectomy with oophorectomy  has right ovary  02/06/2014   GERD  (gastroesophageal reflux disease) 02/06/2014      Plan: Advance diet slowly.  Try Reg diet today Will reorder celebrex now that pt tolerating PO better SL IVF's   LOS: 0 days     .Rosario Adie, MD [facility], [place]    06/08/2021 8:20 AM

## 2021-06-09 ENCOUNTER — Encounter (HOSPITAL_COMMUNITY): Payer: Self-pay | Admitting: Surgery

## 2021-06-09 DIAGNOSIS — K432 Incisional hernia without obstruction or gangrene: Secondary | ICD-10-CM | POA: Diagnosis not present

## 2021-06-09 NOTE — Progress Notes (Signed)
Pt alert and oriented, tolerating diet. D/C instructions given and pt d/cd to home.

## 2021-07-29 ENCOUNTER — Telehealth: Payer: Self-pay | Admitting: Pharmacist

## 2021-07-29 NOTE — Telephone Encounter (Signed)
Patient appearing on report for [employer] - Hypertension Control report due to last documented ambulatory blood pressure of 138/90 on 11/28/2020 (patient was experiencing abdominal pain at this visit - previous blood pressure on 12/05/2020 was 118/76), Next appointment with PCP is not scheduled at this time  ? ?Outreached patient to discuss hypertension control and medication management.  ? ?Current medications:  fosinopril '20mg'$  daily  and hydrochlorothiazide 12.5 mg by mouth once daily ? ?Called Kristopher Oppenheim to check last RF as Epic records show last RF was for 90 days 03/02/2021 however per Kristopher Oppenheim patient filled both fosinopril and hydrochlorothiazide for 90 days on 06/11/2021.  ? ?Patient has an automated upper arm home BP machine but is not checking blood pressure regularly. ? ?Current blood pressure readings: 125/80 (at last dental visit)  ? ? ?Assessment/Plan: ?- Currently controlled ?- - Reviewed goal blood pressure <140/90 ?- Reviewed appropriate administration of medication regimen ?- Reviewed appropriate home BP monitoring technique (avoid caffeine, smoking, and exercise for 30 minutes before checking, rest for at least 5 minutes before taking BP, sit with feet flat on the floor and back against a hard surface, uncross legs, and rest arm on flat surface) ?- Reviewed to check blood pressure twice per week, document, and provide at next provider visit ?- Collaborated with primary care provider office staff to ensure patient has scheduled follow up - appt 11/2021 ? ?Cherre Robins, PharmD ?Clinical Pharmacist ?[facility] ?[place] ? ? ?

## 2021-08-06 DIAGNOSIS — M1610 Unilateral primary osteoarthritis, unspecified hip: Secondary | ICD-10-CM | POA: Insufficient documentation

## 2021-08-06 DIAGNOSIS — Q846 Other congenital malformations of nails: Secondary | ICD-10-CM | POA: Insufficient documentation

## 2021-08-07 ENCOUNTER — Ambulatory Visit (INDEPENDENT_AMBULATORY_CARE_PROVIDER_SITE_OTHER): Payer: Medicare Other

## 2021-08-07 VITALS — Ht 65.0 in | Wt 190.0 lb

## 2021-08-07 DIAGNOSIS — Z0000 Encounter for general adult medical examination without abnormal findings: Secondary | ICD-10-CM

## 2021-08-07 DIAGNOSIS — Z1231 Encounter for screening mammogram for malignant neoplasm of breast: Secondary | ICD-10-CM

## 2021-08-07 DIAGNOSIS — R928 Other abnormal and inconclusive findings on diagnostic imaging of breast: Secondary | ICD-10-CM

## 2021-08-07 NOTE — Addendum Note (Signed)
Addended by: Randal Buba K on: 08/07/2021 12:42 PM ? ? Modules accepted: Orders ? ?

## 2021-08-07 NOTE — Progress Notes (Addendum)
? ?Subjective:  ? Kathryn Pruitt is a 76 y.o. female who presents for an Initial Medicare Annual Wellness Visit. ?Virtual Visit via Telephone Note ? ?I connected with  Kathryn Pruitt on 08/07/21 at 10:15 AM EDT by telephone and verified that I am speaking with the correct person using two identifiers. ? ?Location: ?Patient: HOME ?Provider: [facility] ?Persons participating in the virtual visit: patient/Nurse Health Advisor ?  ?I discussed the limitations, risks, security and privacy concerns of performing an evaluation and management service by telephone and the availability of in person appointments. The patient expressed understanding and agreed to proceed. ? ?Interactive audio and video telecommunications were attempted between this nurse and patient, however failed, due to patient having technical difficulties OR patient did not have access to video capability.  We continued and completed visit with audio only. ? ?Some vital signs may be absent or patient reported.  ? ?Chriss Driver, LPN ? ?Review of Systems    ? ?Cardiac Risk Factors include: advanced age (>92mn, >>67women);hypertension;dyslipidemia;sedentary lifestyle;obesity (BMI >30kg/m2) ? ?   ?Objective:  ?  ?Today's Vitals  ? 08/07/21 1012 08/07/21 1014  ?Weight: 190 lb (86.2 kg)   ?Height: '5\' 5"'$  (1.651 m)   ?PainSc:  3   ? ?Body mass index is 31.62 kg/m?. ? ? ?  08/07/2021  ? 10:30 AM 06/06/2021  ?  1:27 PM 05/28/2021  ?  9:45 AM  ?Advanced Directives  ?Does Patient Have a Medical Advance Directive? No No No  ?Would patient like information on creating a medical advance directive? No - Patient declined No - Patient declined Yes (MAU/Ambulatory/Procedural Areas - Information given)  ? ? ?Current Medications (verified) ?Outpatient Encounter Medications as of 08/07/2021  ?Medication Sig  ? amoxicillin (AMOXIL) 500 MG tablet Take 2,000 mg by mouth once. ONLY WHEN GOING TO THE DENTIST  ? celecoxib (CELEBREX) 100 MG capsule Take one daily  ? Cholecalciferol  (VITAMIN D3 PO) Take 1 tablet by mouth daily.  ? felodipine (PLENDIL) 10 MG 24 hr tablet Take 1 tablet (10 mg total) by mouth daily.  ? fosinopril (MONOPRIL) 20 MG tablet TAKE ONE TABLET BY MOUTH DAILY  ? hydrochlorothiazide (MICROZIDE) 12.5 MG capsule TAKE ONE CAPSULE BY MOUTH DAILY  ? loratadine (CLARITIN) 10 MG tablet Take 10 mg by mouth daily.  ? meclizine (ANTIVERT) 25 MG tablet Take 1 tablet (25 mg total) by mouth 3 (three) times daily as needed for dizziness.  ? pantoprazole (PROTONIX) 40 MG tablet TAKE ONE TABLET BY MOUTH DAILY  ? oxyCODONE-acetaminophen (PERCOCET) 5-325 MG tablet Take 1 tablet by mouth every 4 (four) hours as needed for severe pain. (Patient not taking: Reported on 08/07/2021)  ? [DISCONTINUED] metoCLOPramide (REGLAN) 10 MG tablet Take 10 mg by mouth 2 (two) times daily.  ? ?Facility-Administered Encounter Medications as of 08/07/2021  ?Medication  ? cyanocobalamin ((VITAMIN B-12)) injection 1,000 mcg  ? ? ?Allergies (verified) ?Prednisone  ? ?History: ?Past Medical History:  ?Diagnosis Date  ? Arthritis   ? Hypertension   ? ?Past Surgical History:  ?Procedure Laterality Date  ? ABDOMINAL HYSTERECTOMY  1987  ? FOOT SURGERY Bilateral 04/20/2000  ? TOTAL HIP ARTHROPLASTY Bilateral 04/20/2006  ? [place]  ? XI ROBOTIC ASSISTED VENTRAL HERNIA N/A 06/06/2021  ? Procedure: ROBOTIC INCISIONAL HERNIA REPAIR WITH MESH;  Surgeon: Stechschulte, PNickola Major MD;  Location: [facility];  Service: General;  Laterality: N/A;  ? ?Family History  ?Problem Relation Age of Onset  ? Lung cancer  Mother   ? Hypertension Mother   ? Hypertension Brother   ? Bone cancer Maternal Grandmother   ? ?Social History  ? ?Socioeconomic History  ? Marital status: Married  ?  Spouse name: Not on file  ? Number of children: Not on file  ? Years of education: Not on file  ? Highest education level: Not on file  ?Occupational History  ? Not on file  ?Tobacco Use  ? Smoking status: Former  ?  Packs/day: 0.50  ?  Years:  20.00  ?  Pack years: 10.00  ?  Types: Cigarettes  ?  Quit date: 1998  ?  Years since quitting: 25.3  ? Smokeless tobacco: Never  ?Vaping Use  ? Vaping Use: Never used  ?Substance and Sexual Activity  ? Alcohol use: Not Currently  ? Drug use: Not Currently  ?  Types: Marijuana  ? Sexual activity: Not on file  ?Other Topics Concern  ? Not on file  ?Social History Narrative  ? Not on file  ? ?Social Determinants of Health  ? ?Financial Resource Strain: Low Risk   ? Difficulty of Paying Living Expenses: Not hard at all  ?Food Insecurity: No Food Insecurity  ? Worried About Charity fundraiser in the Last Year: Never true  ? Ran Out of Food in the Last Year: Never true  ?Transportation Needs: No Transportation Needs  ? Lack of Transportation (Medical): No  ? Lack of Transportation (Non-Medical): No  ?Physical Activity: Inactive  ? Days of Exercise per Week: 0 days  ? Minutes of Exercise per Session: 0 min  ?Stress: No Stress Concern Present  ? Feeling of Stress : Not at all  ?Social Connections: Moderately Isolated  ? Frequency of Communication with Friends and Family: More than three times a week  ? Frequency of Social Gatherings with Friends and Family: More than three times a week  ? Attends Religious Services: Never  ? Active Member of Clubs or Organizations: No  ? Attends Archivist Meetings: Never  ? Marital Status: Married  ? ? ?Tobacco Counseling ?Counseling given: Not Answered ? ? ?Clinical Intake: ? ?Pre-visit preparation completed: Yes ? ?Pain : 0-10 ?Pain Score: 3  ?Pain Type: Neuropathic pain ?Pain Location: Abdomen ?Pain Descriptors / Indicators: Sharp ?Pain Onset: More than a month ago ?Pain Frequency: Intermittent ? ?  ? ?BMI - recorded: 31.62 ?Nutritional Status: BMI > 30  Obese ?Nutritional Risks: None ?Diabetes: No ? ?How often do you need to have someone help you when you read instructions, pamphlets, or other written materials from your doctor or pharmacy?: 1 -  Never ? ?Diabetic?NO ? ?Interpreter Needed?: No ? ?Information entered by :: mj Genever Hentges, lpn ? ? ?Activities of Daily Living ? ?  08/07/2021  ? 10:32 AM 06/06/2021  ?  8:14 PM  ?In your present state of health, do you have any difficulty performing the following activities:  ?Hearing? 0 0  ?Vision? 0 0  ?Difficulty concentrating or making decisions? 1 0  ?Comment Memory at times.   ?Walking or climbing stairs? 0 0  ?Dressing or bathing? 0 0  ?Doing errands, shopping? 0 0  ?Preparing Food and eating ? N   ?Using the Toilet? N   ?In the past six months, have you accidently leaked urine? N   ?Do you have problems with loss of bowel control? N   ?Managing your Medications? N   ?Managing your Finances? N   ?Housekeeping or managing your Housekeeping? N   ? ? ?  Patient Care Team: ?Marrian Salvage, FNP as PCP - General (Internal Medicine) ? ?Indicate any recent Medical Services you may have received from other than [facility] providers in the past year (date may be approximate). ? ?   ?Assessment:  ? This is a routine wellness examination for Margeart. ? ?Hearing/Vision screen ?Hearing Screening - Comments:: No hearing loss. ?Vision Screening - Comments:: Glasses. Dr. Moshe Cipro 2021. Pt states she will call to schedule exam.  ? ?Dietary issues and exercise activities discussed: ?Current Exercise Habits: The patient does not participate in regular exercise at present, Exercise limited by: cardiac condition(s);orthopedic condition(s) ? ? Goals Addressed   ? ?  ?  ?  ?  ? This Visit's Progress  ?  Exercise 3x per week (30 min per time)     ?  Try to exercise more and stay healthy.  ?Travel more. ?  ? ?  ? ?Depression Screen ? ?  08/07/2021  ? 10:22 AM 01/21/2021  ?  9:39 AM 12/05/2020  ? 10:02 AM 07/16/2014  ? 11:37 AM  ?PHQ 2/9 Scores  ?PHQ - 2 Score 1 0 0 0  ?  ?Fall Risk ? ?  08/07/2021  ? 10:30 AM 01/21/2021  ?  9:39 AM 12/05/2020  ? 10:02 AM 07/16/2014  ? 11:37 AM  ?Fall Risk   ?Falls in the past year? 0 0 1 Yes  ?Number falls in past  yr: 0 0 0 1  ?Injury with Fall? 0 0 0 No  ?Risk for fall due to : No Fall Risks No Fall Risks    ?Follow up Falls prevention discussed Falls evaluation completed    ? ? ?FALL RISK PREVENTION PERTAINING TO THE HOME: ? ?Any stairs in

## 2021-08-07 NOTE — Patient Instructions (Signed)
Ms. Dovidio , ?Thank you for taking time to come for your Medicare Wellness Visit. I appreciate your ongoing commitment to your health goals. Please review the following plan we discussed and let me know if I can assist you in the future.  ? ?Screening recommendations/referrals: ?Colonoscopy: Done 04/20/2020 Repeat in 10 years ? ?Mammogram: Done 01/23/2020 Order placed today.  ?Bone Density: Done 2022 at [facility] Repeat every 2 years ? ?Recommended yearly ophthalmology/optometry visit for glaucoma screening and checkup ?Recommended yearly dental visit for hygiene and checkup ? ?Vaccinations: ?Influenza vaccine: Done 01/21/2021 Repeat annually ? ?Pneumococcal vaccine: Discussed.  ?Tdap vaccine: Discussed. ?Shingles vaccine: Discussed   ?Covid-19:Declined. ? ?Advanced directives: Please bring a copy of your health care power of attorney and living will to the office to be added to your chart at your convenience. ? ? ?Conditions/risks identified: Aim for 30 minutes of exercise or brisk walking, 6-8 glasses of water, and 5 servings of fruits and vegetables each day. ? ? ?Next appointment: Follow up in one year for your annual wellness visit 2024. ? ? ?Preventive Care 42 Years and Older, Female ?Preventive care refers to lifestyle choices and visits with your health care provider that can promote health and wellness. ?What does preventive care include? ?A yearly physical exam. This is also called an annual well check. ?Dental exams once or twice a year. ?Routine eye exams. Ask your health care provider how often you should have your eyes checked. ?Personal lifestyle choices, including: ?Daily care of your teeth and gums. ?Regular physical activity. ?Eating a healthy diet. ?Avoiding tobacco and drug use. ?Limiting alcohol use. ?Practicing safe sex. ?Taking low-dose aspirin every day. ?Taking vitamin and mineral supplements as recommended by your health care provider. ?What happens during an annual well check? ?The  services and screenings done by your health care provider during your annual well check will depend on your age, overall health, lifestyle risk factors, and family history of disease. ?Counseling  ?Your health care provider may ask you questions about your: ?Alcohol use. ?Tobacco use. ?Drug use. ?Emotional well-being. ?Home and relationship well-being. ?Sexual activity. ?Eating habits. ?History of falls. ?Memory and ability to understand (cognition). ?Work and work Statistician. ?Reproductive health. ?Screening  ?You may have the following tests or measurements: ?Height, weight, and BMI. ?Blood pressure. ?Lipid and cholesterol levels. These may be checked every 5 years, or more frequently if you are over 92 years old. ?Skin check. ?Lung cancer screening. You may have this screening every year starting at age 67 if you have a 30-pack-year history of smoking and currently smoke or have quit within the past 15 years. ?Fecal occult blood test (FOBT) of the stool. You may have this test every year starting at age 75. ?Flexible sigmoidoscopy or colonoscopy. You may have a sigmoidoscopy every 5 years or a colonoscopy every 10 years starting at age 34. ?Hepatitis C blood test. ?Hepatitis B blood test. ?Sexually transmitted disease (STD) testing. ?Diabetes screening. This is done by checking your blood sugar (glucose) after you have not eaten for a while (fasting). You may have this done every 1-3 years. ?Bone density scan. This is done to screen for osteoporosis. You may have this done starting at age 40. ?Mammogram. This may be done every 1-2 years. Talk to your health care provider about how often you should have regular mammograms. ?Talk with your health care provider about your test results, treatment options, and if necessary, the need for more tests. ?Vaccines  ?Your health care provider may  recommend certain vaccines, such as: ?Influenza vaccine. This is recommended every year. ?Tetanus, diphtheria, and acellular  pertussis (Tdap, Td) vaccine. You may need a Td booster every 10 years. ?Zoster vaccine. You may need this after age 62. ?Pneumococcal 13-valent conjugate (PCV13) vaccine. One dose is recommended after age 46. ?Pneumococcal polysaccharide (PPSV23) vaccine. One dose is recommended after age 60. ?Talk to your health care provider about which screenings and vaccines you need and how often you need them. ?This information is not intended to replace advice given to you by your health care provider. Make sure you discuss any questions you have with your health care provider. ?Document Released: 05/03/2015 Document Revised: 12/25/2015 Document Reviewed: 02/05/2015 ?Elsevier Interactive Patient Education ? 2017 [place]. ? ?Fall Prevention in the Home ?Falls can cause injuries. They can happen to people of all ages. There are many things you can do to make your home safe and to help prevent falls. ?What can I do on the outside of my home? ?Regularly fix the edges of walkways and driveways and fix any cracks. ?Remove anything that might make you trip as you walk through a door, such as a raised step or threshold. ?Trim any bushes or trees on the path to your home. ?Use bright outdoor lighting. ?Clear any walking paths of anything that might make someone trip, such as rocks or tools. ?Regularly check to see if handrails are loose or broken. Make sure that both sides of any steps have handrails. ?Any raised decks and porches should have guardrails on the edges. ?Have any leaves, snow, or ice cleared regularly. ?Use sand or salt on walking paths during winter. ?Clean up any spills in your garage right away. This includes oil or grease spills. ?What can I do in the bathroom? ?Use night lights. ?Install grab bars by the toilet and in the tub and shower. Do not use towel bars as grab bars. ?Use non-skid mats or decals in the tub or shower. ?If you need to sit down in the shower, use a plastic, non-slip stool. ?Keep the floor  dry. Clean up any water that spills on the floor as soon as it happens. ?Remove soap buildup in the tub or shower regularly. ?Attach bath mats securely with double-sided non-slip rug tape. ?Do not have throw rugs and other things on the floor that can make you trip. ?What can I do in the bedroom? ?Use night lights. ?Make sure that you have a light by your bed that is easy to reach. ?Do not use any sheets or blankets that are too big for your bed. They should not hang down onto the floor. ?Have a firm chair that has side arms. You can use this for support while you get dressed. ?Do not have throw rugs and other things on the floor that can make you trip. ?What can I do in the kitchen? ?Clean up any spills right away. ?Avoid walking on wet floors. ?Keep items that you use a lot in easy-to-reach places. ?If you need to reach something above you, use a strong step stool that has a grab bar. ?Keep electrical cords out of the way. ?Do not use floor polish or wax that makes floors slippery. If you must use wax, use non-skid floor wax. ?Do not have throw rugs and other things on the floor that can make you trip. ?What can I do with my stairs? ?Do not leave any items on the stairs. ?Make sure that there are handrails on both sides of  the stairs and use them. Fix handrails that are broken or loose. Make sure that handrails are as long as the stairways. ?Check any carpeting to make sure that it is firmly attached to the stairs. Fix any carpet that is loose or worn. ?Avoid having throw rugs at the top or bottom of the stairs. If you do have throw rugs, attach them to the floor with carpet tape. ?Make sure that you have a light switch at the top of the stairs and the bottom of the stairs. If you do not have them, ask someone to add them for you. ?What else can I do to help prevent falls? ?Wear shoes that: ?Do not have high heels. ?Have rubber bottoms. ?Are comfortable and fit you well. ?Are closed at the toe. Do not wear  sandals. ?If you use a stepladder: ?Make sure that it is fully opened. Do not climb a closed stepladder. ?Make sure that both sides of the stepladder are locked into place. ?Ask someone to hold it for you, if possible

## 2021-08-14 ENCOUNTER — Other Ambulatory Visit: Payer: Self-pay | Admitting: Family

## 2021-08-22 ENCOUNTER — Ambulatory Visit
Admission: RE | Admit: 2021-08-22 | Discharge: 2021-08-22 | Disposition: A | Discharge status: Home / Self Care | Payer: Medicare Other | Source: Ambulatory Visit | Attending: Internal Medicine | Admitting: Internal Medicine

## 2021-08-22 DIAGNOSIS — Z1231 Encounter for screening mammogram for malignant neoplasm of breast: Secondary | ICD-10-CM

## 2021-08-22 DIAGNOSIS — R928 Other abnormal and inconclusive findings on diagnostic imaging of breast: Secondary | ICD-10-CM

## 2021-09-11 ENCOUNTER — Emergency Department (HOSPITAL_BASED_OUTPATIENT_CLINIC_OR_DEPARTMENT_OTHER)
Admission: EM | Admit: 2021-09-11 | Discharge: 2021-09-11 | Disposition: A | Discharge status: Home / Self Care | Payer: Medicare Other | Attending: Emergency Medicine | Admitting: Emergency Medicine

## 2021-09-11 ENCOUNTER — Encounter (HOSPITAL_BASED_OUTPATIENT_CLINIC_OR_DEPARTMENT_OTHER): Payer: Self-pay | Admitting: Emergency Medicine

## 2021-09-11 DIAGNOSIS — R519 Headache, unspecified: Secondary | ICD-10-CM | POA: Insufficient documentation

## 2021-09-11 DIAGNOSIS — R112 Nausea with vomiting, unspecified: Secondary | ICD-10-CM | POA: Insufficient documentation

## 2021-09-11 DIAGNOSIS — Z20822 Contact with and (suspected) exposure to covid-19: Secondary | ICD-10-CM | POA: Diagnosis not present

## 2021-09-11 DIAGNOSIS — H9202 Otalgia, left ear: Secondary | ICD-10-CM | POA: Insufficient documentation

## 2021-09-11 LAB — RESP PANEL BY RT-PCR (FLU A&B, COVID) ARPGX2
Influenza A by PCR: NEGATIVE
Influenza B by PCR: NEGATIVE
SARS Coronavirus 2 by RT PCR: NEGATIVE

## 2021-09-11 NOTE — ED Provider Notes (Signed)
[facility] EMERGENCY DEPARTMENT Provider Note   CSN: [id number] Arrival date & time: 09/11/21  1309     History  Chief Complaint  Patient presents with   Headache   Nausea   Otalgia    Kathryn Pruitt is a 76 y.o. female.  Presents to ER due to concern for ear pain.  Patient states that earlier today she noted left ear discomfort, pressure sensation.  Since being in the ER her ear seems to have gotten better.  She does not have any ongoing ear pain at this time.  She also reports that on Monday she had an episode of nausea with 1 episode of vomiting.  Also had a mild headache at the time.  She reports that the headache nausea and vomiting have since completely resolved.  She has no ongoing symptoms at this time.  No associated chills or fevers.  No chest pain or difficulty breathing.  No numbness, weakness, speech or vision change.  HPI     Home Medications Prior to Admission medications   Medication Sig Start Date End Date Taking? Authorizing Provider  amoxicillin (AMOXIL) 500 MG tablet Take 2,000 mg by mouth once. ONLY WHEN GOING TO THE DENTIST 05/17/21   [provider]  celecoxib (CELEBREX) 100 MG capsule TAKE ONE CAPSULE BY MOUTH DAILY 08/14/21   Marrian Salvage, FNP  Cholecalciferol (VITAMIN D3 PO) Take 1 tablet by mouth daily.    [provider]  felodipine (PLENDIL) 10 MG 24 hr tablet Take 1 tablet (10 mg total) by mouth daily. 12/05/20   Marrian Salvage, FNP  fosinopril (MONOPRIL) 20 MG tablet TAKE ONE TABLET BY MOUTH DAILY 06/02/21   Marrian Salvage, FNP  hydrochlorothiazide (MICROZIDE) 12.5 MG capsule TAKE ONE CAPSULE BY MOUTH DAILY 06/02/21   Marrian Salvage, FNP  loratadine (CLARITIN) 10 MG tablet Take 10 mg by mouth daily.    [provider]  meclizine (ANTIVERT) 25 MG tablet Take 1 tablet (25 mg total) by mouth 3 (three) times daily as needed for dizziness. 12/05/20   Marrian Salvage, FNP   oxyCODONE-acetaminophen (PERCOCET) 5-325 MG tablet Take 1 tablet by mouth every 4 (four) hours as needed for severe pain. Patient not taking: Reported on 08/07/2021 06/06/21 06/06/22  Stechschulte, Nickola Major, MD  pantoprazole (PROTONIX) 40 MG tablet TAKE ONE TABLET BY MOUTH DAILY 06/04/21   Marrian Salvage, FNP  metoCLOPramide (REGLAN) 10 MG tablet Take 10 mg by mouth 2 (two) times daily. 06/22/17 12/01/19  [provider]      Allergies    Prednisone    Review of Systems   Review of Systems  Constitutional:  Negative for chills and fever.  HENT:  Positive for ear pain. Negative for sore throat.   Eyes:  Negative for pain and visual disturbance.  Respiratory:  Negative for cough and shortness of breath.   Cardiovascular:  Negative for chest pain and palpitations.  Gastrointestinal:  Positive for nausea and vomiting. Negative for abdominal pain.  Genitourinary:  Negative for dysuria and hematuria.  Musculoskeletal:  Negative for arthralgias and back pain.  Skin:  Negative for color change and rash.  Neurological:  Positive for headaches. Negative for seizures and syncope.  All other systems reviewed and are negative.  Physical Exam Updated Vital Signs BP (!) 176/112   Pulse 72   Temp 98.2 F (36.8 C) (Oral)   Resp 19   Ht '5\' 5"'$  (1.651 m)   Wt 86.2 kg   SpO2  100%   BMI 31.62 kg/m  Physical Exam Vitals and nursing note reviewed.  Constitutional:      General: She is not in acute distress.    Appearance: She is well-developed.  HENT:     Head: Normocephalic and atraumatic.     Right Ear: Tympanic membrane, ear canal and external ear normal.     Left Ear: Tympanic membrane, ear canal and external ear normal.  Eyes:     Conjunctiva/sclera: Conjunctivae normal.  Cardiovascular:     Rate and Rhythm: Normal rate and regular rhythm.     Heart sounds: No murmur heard. Pulmonary:     Effort: Pulmonary effort is normal. No respiratory distress.     Breath sounds: Normal  breath sounds.  Abdominal:     Palpations: Abdomen is soft.     Tenderness: There is no abdominal tenderness.  Musculoskeletal:        General: No swelling.     Cervical back: Neck supple.  Skin:    General: Skin is warm and dry.     Capillary Refill: Capillary refill takes less than 2 seconds.  Neurological:     Mental Status: She is alert.  Psychiatric:        Mood and Affect: Mood normal.    ED Results / Procedures / Treatments   Labs (all labs ordered are listed, but only abnormal results are displayed) Labs Reviewed  RESP PANEL BY RT-PCR (FLU A&B, COVID) [id number]    EKG None  Radiology No results found.  Procedures Procedures    Medications Ordered in ED Medications - No data to display  ED Course/ Medical Decision Making/ A&P                           Medical Decision Making  76 year old lady presents to ER due to concern for left ear pain.  On taking history and on review of systems patient also endorsed earlier this week she had an episode of nausea and vomiting and headache.  Patient however now denies any ongoing symptoms.  When I evaluated her left ear it looked completely normal and symmetric with her right ear.  No evidence for acute otitis media on my exam or otitis externa.  There is no tenderness over the temporal region, no tenderness over the mastoid process.  Because patient had this report of nausea and vomiting earlier this week, I offered to obtain blood work and do some additional testing and monitoring in ER however because patient's symptoms had completely resolved, patient requested to be discharged at this time.  In light of additional work-up in the ER, I recommend she follow-up with her primary care doctor to discuss the symptoms further.  Given the patient has no ongoing symptoms and is remarkably well-appearing, feel she can be discharged and pursue outpatient work-up.  Vital signs are stable, noted some hypertension.  Given no specific symptoms  associated with hypertension at this time, feel this also can be followed up with her primary care doctor.    After the discussed management above, the patient was determined to be safe for discharge.  The patient was in agreement with this plan and all questions regarding their care were answered.  ED return precautions were discussed and the patient will return to the ED with any significant worsening of condition.         Final Clinical Impression(s) / ED Diagnoses Final diagnoses:  Left ear pain  Rx / DC Orders ED Discharge Orders     None         Lucrezia Starch, MD 09/12/21 [phone]

## 2021-09-11 NOTE — ED Triage Notes (Addendum)
Left sided headache, left ear, and left neck pain since Monday. Also endorses n/v, fevers. Denies weakness, dizziness, balance issues, falls, head trauma, diarrhea.

## 2021-09-11 NOTE — Discharge Instructions (Signed)
Please follow-up with your primary care doctor to discuss your symptoms further.  If you have any recurrence of nausea vomiting, if you develop fever chills, chest pain or shortness of breath, please return immediately to ER for reassessment.

## 2021-10-06 ENCOUNTER — Other Ambulatory Visit: Payer: Self-pay | Admitting: Family

## 2021-11-28 ENCOUNTER — Other Ambulatory Visit: Payer: Self-pay | Admitting: Family

## 2021-12-02 ENCOUNTER — Encounter: Payer: Self-pay | Admitting: Family

## 2021-12-02 ENCOUNTER — Ambulatory Visit (INDEPENDENT_AMBULATORY_CARE_PROVIDER_SITE_OTHER): Payer: Medicare Other | Admitting: Family

## 2021-12-02 VITALS — BP 130/78 | HR 62 | Temp 98.2°F | Ht 65.0 in | Wt 197.6 lb

## 2021-12-02 DIAGNOSIS — E042 Nontoxic multinodular goiter: Secondary | ICD-10-CM | POA: Diagnosis not present

## 2021-12-02 DIAGNOSIS — E785 Hyperlipidemia, unspecified: Secondary | ICD-10-CM | POA: Diagnosis not present

## 2021-12-02 DIAGNOSIS — M159 Polyosteoarthritis, unspecified: Secondary | ICD-10-CM

## 2021-12-02 DIAGNOSIS — I10 Essential (primary) hypertension: Secondary | ICD-10-CM | POA: Diagnosis not present

## 2021-12-02 DIAGNOSIS — E538 Deficiency of other specified B group vitamins: Secondary | ICD-10-CM

## 2021-12-02 DIAGNOSIS — E041 Nontoxic single thyroid nodule: Secondary | ICD-10-CM

## 2021-12-02 DIAGNOSIS — K219 Gastro-esophageal reflux disease without esophagitis: Secondary | ICD-10-CM

## 2021-12-02 LAB — LIPID PANEL
Cholesterol: 312 mg/dL — ABNORMAL HIGH (ref 0–200)
HDL: 68.9 mg/dL (ref >39.00)
LDL Cholesterol: 217 mg/dL — ABNORMAL HIGH (ref 0–99)
NonHDL: 242.98
Total CHOL/HDL Ratio: 5
Triglycerides: 129 mg/dL (ref 0.0–149.0)
VLDL: 25.8 mg/dL (ref 0.0–40.0)

## 2021-12-02 LAB — TSH: TSH: 3.6 u[IU]/mL (ref 0.35–5.50)

## 2021-12-02 LAB — CBC WITH DIFFERENTIAL/PLATELET
Basophils Absolute: 0.1 10*3/uL (ref 0.0–0.1)
Basophils Relative: 0.9 % (ref 0.0–3.0)
Eosinophils Absolute: 0.1 10*3/uL (ref 0.0–0.7)
Eosinophils Relative: 1.5 % (ref 0.0–5.0)
HCT: 46.2 % — ABNORMAL HIGH (ref 36.0–46.0)
Hemoglobin: 15.4 g/dL — ABNORMAL HIGH (ref 12.0–15.0)
Lymphocytes Relative: 44 % (ref 12.0–46.0)
Lymphs Abs: 3.1 10*3/uL (ref 0.7–4.0)
MCHC: 33.3 g/dL (ref 30.0–36.0)
MCV: 90.1 fl (ref 78.0–100.0)
Monocytes Absolute: 0.4 10*3/uL (ref 0.1–1.0)
Monocytes Relative: 5.9 % (ref 3.0–12.0)
Neutro Abs: 3.3 10*3/uL (ref 1.4–7.7)
Neutrophils Relative %: 47.7 % (ref 43.0–77.0)
Platelets: 293 10*3/uL (ref 150.0–400.0)
RBC: 5.13 Mil/uL — ABNORMAL HIGH (ref 3.87–5.11)
RDW: 15.1 % (ref 11.5–15.5)
WBC: 7 10*3/uL (ref 4.0–10.5)

## 2021-12-02 LAB — COMPREHENSIVE METABOLIC PANEL
ALT: 14 U/L (ref 0–35)
AST: 19 U/L (ref 0–37)
Albumin: 4.4 g/dL (ref 3.5–5.2)
Alkaline Phosphatase: 85 U/L (ref 39–117)
BUN: 15 mg/dL (ref 6–23)
CO2: 28 mEq/L (ref 19–32)
Calcium: 9.6 mg/dL (ref 8.4–10.5)
Chloride: 101 mEq/L (ref 96–112)
Creatinine, Ser: 0.99 mg/dL (ref 0.40–1.20)
GFR: 56.37 mL/min — ABNORMAL LOW (ref >60.00)
Glucose, Bld: 83 mg/dL (ref 70–99)
Potassium: 3.9 mEq/L (ref 3.5–5.1)
Sodium: 139 mEq/L (ref 135–145)
Total Bilirubin: 1 mg/dL (ref 0.2–1.2)
Total Protein: 7.2 g/dL (ref 6.0–8.3)

## 2021-12-02 LAB — VITAMIN B12: Vitamin B-12: 300 pg/mL (ref 211–911)

## 2021-12-02 MED ORDER — PANTOPRAZOLE SODIUM 40 MG PO TBEC: 40.0000 mg | DELAYED_RELEASE_TABLET | Freq: Every day | ORAL | 3 refills | Status: DC

## 2021-12-02 MED ORDER — FELODIPINE ER 10 MG PO TB24: 10.0000 mg | ORAL_TABLET | Freq: Every day | ORAL | 3 refills | Status: DC

## 2021-12-02 MED ORDER — HYDROCHLOROTHIAZIDE 12.5 MG PO CAPS: 12.5000 mg | ORAL_CAPSULE | Freq: Every day | ORAL | 3 refills | Status: DC

## 2021-12-02 MED ORDER — CELECOXIB 100 MG PO CAPS: ORAL_CAPSULE | ORAL | 0 refills | Status: DC

## 2021-12-02 MED ORDER — FOSINOPRIL SODIUM 20 MG PO TABS: 20.0000 mg | ORAL_TABLET | Freq: Every day | ORAL | 3 refills | Status: DC

## 2021-12-02 NOTE — Progress Notes (Signed)
Kathryn Pruitt is a 76 y.o. female with the following history as recorded in EpicCare:  Patient Active Problem List   Diagnosis Date Noted   Anomalies of nails 08/06/2021   Osteoarthrosis, pelvic region and thigh 08/06/2021   Incisional hernia 06/06/2021   Acquired absence of genital organ 03/03/2021   Acute cholecystitis 03/03/2021   Allergic rhinitis 03/03/2021   Cellulitis and abscess of leg 03/03/2021   Symptomatic menopausal or female climacteric states 03/03/2021   Sprain and strain of hip and thigh 03/03/2021   Somatic dysfunction of lumbar region 03/03/2021   Skin sensation disturbance 03/03/2021   Screen for colon cancer 03/03/2021   Reduced libido 03/03/2021   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   Pain in joint, pelvic region and thigh 03/03/2021   Nonallopathic lesion of sacral region 03/03/2021   Neuralgia 03/03/2021   Myalgia and myositis 03/03/2021   Microscopic hematuria 03/03/2021   Meralgia paresthetica 03/03/2021   Chronic pain syndrome 03/03/2021   Heartburn 03/03/2021   Hyperlipidemia 03/03/2021   Lower back pain 03/03/2021   Body mass index (BMI) 30.0-30.9, adult 12/11/2019   HSV (herpes simplex virus) anogenital infection 05/31/2019   Allergy to pollen 05/11/2019   Vertigo 09/21/2017   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   Lumbar radiculopathy 03/22/2015   Lumbar degenerative disc disease 03/22/2015   Abnormal mammogram see [facility] mm stable left breast mass since 2013 felt to be benign  08/13/2014   Osteopenia  Dexa done 07/20/2014  [facility] 08/06/2014   DJD (degenerative joint disease) 02/06/2014   HTN (hypertension) 02/06/2014   Tobacco use disorder  quit 20 years ago  02/06/2014   Marijuana use, episodic 02/06/2014   S/P hysterectomy with oophorectomy  has right ovary  02/06/2014   GERD (gastroesophageal reflux disease) 02/06/2014    Current Outpatient Medications  Medication  Sig Dispense Refill   amoxicillin (AMOXIL) 500 MG tablet Take 2,000 mg by mouth once. ONLY WHEN GOING TO THE DENTIST     Cholecalciferol (VITAMIN D3 PO) Take 1 tablet by mouth daily.     loratadine (CLARITIN) 10 MG tablet Take 10 mg by mouth daily.     celecoxib (CELEBREX) 100 MG capsule TAKE ONE CAPSULE BY MOUTH DAILY 90 capsule 0   felodipine (PLENDIL) 10 MG 24 hr tablet Take 1 tablet (10 mg total) by mouth daily. 90 tablet 3   fosinopril (MONOPRIL) 20 MG tablet Take 1 tablet (20 mg total) by mouth daily. 90 tablet 3   hydrochlorothiazide (MICROZIDE) 12.5 MG capsule Take 1 capsule (12.5 mg total) by mouth daily. 90 capsule 3   pantoprazole (PROTONIX) 40 MG tablet Take 1 tablet (40 mg total) by mouth daily. 90 tablet 3   Current Facility-Administered Medications  Medication Dose Route Frequency Provider Last Rate Last Admin   cyanocobalamin ((VITAMIN B-12)) injection 1,000 mcg  1,000 mcg Intramuscular Q30 days Marrian Salvage, FNP   1,000 mcg at 12/11/20 1033    Allergies: Prednisone and Statins  Past Medical History:  Diagnosis Date   Arthritis    Hypertension     Past Surgical History:  Procedure Laterality Date   ABDOMINAL HYSTERECTOMY  1987   FOOT SURGERY Bilateral 04/20/2000   TOTAL HIP ARTHROPLASTY Bilateral 04/20/2006   WISDOM TOOTH EXTRACTION  1986   XI ROBOTIC ASSISTED VENTRAL HERNIA N/A 06/06/2021   Procedure: ROBOTIC INCISIONAL HERNIA REPAIR WITH MESH;  Surgeon: Felicie Morn, MD;  Location: [facility];  Service: General;  Laterality: N/A;    Family History  Problem Relation Age of Onset   Lung cancer Mother    Hypertension Mother    Hypertension Brother    Bone cancer Maternal Grandmother     Social History   Tobacco Use   Smoking status: Former    Packs/day: 0.50    Years: 20.00    Total pack years: 10.00    Types: Cigarettes    Quit date: 1998    Years since quitting: 25.6   Smokeless tobacco: Never  Substance Use Topics   Alcohol use: Not  Currently    Subjective:  Presents for yearly follow up on chronic care needs; Would like to get labs updated;  Opted not to take Zetia that was prescribed previously- has been intolerant to statins;  Denies any chest pain, shortness of breath, blurred vision or headache     Objective:  Vitals:   12/02/21 1041  BP: 130/78  Pulse: 62  Temp: 98.2 F (36.8 C)  TempSrc: Oral  SpO2: 99%  Weight: 197 lb 9.6 oz (89.6 kg)  Height: _0  (1.651 m)    General: Well developed, well nourished, in no acute distress  Skin : Warm and dry.  Head: Normocephalic and atraumatic  Eyes: Sclera and conjunctiva clear; pupils round and reactive to light; extraocular movements intact  Ears: External normal; canals clear; tympanic membranes normal  Oropharynx: Pink, supple. No suspicious lesions  Neck: Supple without thyromegaly, adenopathy  Lungs: Respirations unlabored; clear to auscultation bilaterally without wheeze, rales, rhonchi  CVS exam: normal rate and regular rhythm.  Abdomen: Soft; nontender; nondistended; normoactive bowel sounds; no masses or hepatosplenomegaly  Musculoskeletal: No deformities; no active joint inflammation  Extremities: No edema, cyanosis, clubbing  Vessels: Symmetric bilaterally  Neurologic: Alert and oriented; speech intact; face symmetrical; moves all extremities well; CNII-XII intact without focal deficit   Assessment:  1. Primary hypertension   2. Hyperlipidemia, unspecified hyperlipidemia type   3. Low serum vitamin B12   4. Multiple thyroid nodules   5. Thyroid nodule   6. Gastroesophageal reflux disease without esophagitis   7. Primary osteoarthritis involving multiple joints     Plan:  Stable; refills updated; check CBC, CMP today; Check lipid panel today- not currently on Zetia but notes she would be willing to re-consider taking a cholesterol medication if needed; has not been able to tolerate statins. Check B12 level- patient was lost to follow up  earlier this year; Update thyroid ultrasound; follow up to be determined; Check Tsh;  6.   Stable; refill updated;  7.   Stable; refill updated;   No follow-ups on file.  Orders Placed This Encounter  Procedures   US THYROID    Standing Status:   Future    Standing Expiration Date:   12/03/2022    Order Specific Question:   Reason for Exam (SYMPTOM  OR DIAGNOSIS REQUIRED)    Answer:   thyroid nodules    Order Specific Question:   Preferred imaging location?    Answer:   [facility]   CBC with Differential/Platelet   Comp Met (CMET)   Lipid panel   B12   TSH    Requested Prescriptions   Signed Prescriptions Disp Refills   felodipine (PLENDIL) 10 MG 24 hr tablet 90 tablet 3    Sig: Take 1 tablet (10 mg total) by mouth daily.   fosinopril (MONOPRIL) 20 MG tablet 90 tablet 3    Sig: Take 1 tablet (  20 mg total) by mouth daily.   hydrochlorothiazide (MICROZIDE) 12.5 MG capsule 90 capsule 3    Sig: Take 1 capsule (12.5 mg total) by mouth daily.   pantoprazole (PROTONIX) 40 MG tablet 90 tablet 3    Sig: Take 1 tablet (40 mg total) by mouth daily.   celecoxib (CELEBREX) 100 MG capsule 90 capsule 0    Sig: TAKE ONE CAPSULE BY MOUTH DAILY

## 2021-12-03 ENCOUNTER — Other Ambulatory Visit: Payer: Self-pay | Admitting: Family

## 2021-12-03 DIAGNOSIS — E785 Hyperlipidemia, unspecified: Secondary | ICD-10-CM

## 2021-12-03 MED ORDER — EZETIMIBE 10 MG PO TABS: 10.0000 mg | ORAL_TABLET | Freq: Every day | ORAL | 3 refills | Status: DC

## 2021-12-04 ENCOUNTER — Ambulatory Visit (HOSPITAL_BASED_OUTPATIENT_CLINIC_OR_DEPARTMENT_OTHER)
Admission: RE | Admit: 2021-12-04 | Discharge: 2021-12-04 | Disposition: A | Discharge status: Home / Self Care | Payer: Medicare Other | Source: Ambulatory Visit | Attending: Family | Admitting: Family

## 2021-12-04 DIAGNOSIS — E041 Nontoxic single thyroid nodule: Secondary | ICD-10-CM | POA: Insufficient documentation

## 2021-12-04 MED ORDER — EZETIMIBE 10 MG PO TABS: 10.0000 mg | ORAL_TABLET | Freq: Every day | ORAL | 3 refills | Status: DC

## 2021-12-04 NOTE — Addendum Note (Signed)
Addended by: Kittie Plater, Ceanna Wareing HUA on: 12/04/2021 09:02 AM   Modules accepted: Orders

## 2021-12-05 ENCOUNTER — Other Ambulatory Visit: Payer: Self-pay | Admitting: Family

## 2021-12-05 DIAGNOSIS — R946 Abnormal results of thyroid function studies: Secondary | ICD-10-CM

## 2022-02-25 ENCOUNTER — Other Ambulatory Visit: Payer: Self-pay | Admitting: Family

## 2022-04-24 ENCOUNTER — Emergency Department (HOSPITAL_BASED_OUTPATIENT_CLINIC_OR_DEPARTMENT_OTHER)
Admission: EM | Admit: 2022-04-24 | Discharge: 2022-04-24 | Disposition: A | Discharge status: Home / Self Care | Payer: Medicare Other | Attending: Emergency Medicine | Admitting: Emergency Medicine

## 2022-04-24 ENCOUNTER — Emergency Department (HOSPITAL_BASED_OUTPATIENT_CLINIC_OR_DEPARTMENT_OTHER): Payer: Medicare Other

## 2022-04-24 ENCOUNTER — Encounter (HOSPITAL_BASED_OUTPATIENT_CLINIC_OR_DEPARTMENT_OTHER): Payer: Self-pay | Admitting: Urology

## 2022-04-24 ENCOUNTER — Other Ambulatory Visit: Payer: Self-pay

## 2022-04-24 DIAGNOSIS — N390 Urinary tract infection, site not specified: Secondary | ICD-10-CM | POA: Diagnosis not present

## 2022-04-24 DIAGNOSIS — R1084 Generalized abdominal pain: Secondary | ICD-10-CM | POA: Insufficient documentation

## 2022-04-24 DIAGNOSIS — Z79899 Other long term (current) drug therapy: Secondary | ICD-10-CM | POA: Diagnosis not present

## 2022-04-24 LAB — CBC WITH DIFFERENTIAL/PLATELET
Abs Immature Granulocytes: 0.04 10*3/uL (ref 0.00–0.07)
Basophils Absolute: 0 10*3/uL (ref 0.0–0.1)
Basophils Relative: 0 %
Eosinophils Absolute: 0.1 10*3/uL (ref 0.0–0.5)
Eosinophils Relative: 0 %
HCT: 45.7 % (ref 36.0–46.0)
Hemoglobin: 15.7 g/dL — ABNORMAL HIGH (ref 12.0–15.0)
Immature Granulocytes: 0 %
Lymphocytes Relative: 29 %
Lymphs Abs: 4 10*3/uL (ref 0.7–4.0)
MCH: 29.8 pg (ref 26.0–34.0)
MCHC: 34.4 g/dL (ref 30.0–36.0)
MCV: 86.9 fL (ref 80.0–100.0)
Monocytes Absolute: 0.9 10*3/uL (ref 0.1–1.0)
Monocytes Relative: 7 %
Neutro Abs: 8.5 10*3/uL — ABNORMAL HIGH (ref 1.7–7.7)
Neutrophils Relative %: 64 %
Platelets: 306 10*3/uL (ref 150–400)
RBC: 5.26 MIL/uL — ABNORMAL HIGH (ref 3.87–5.11)
RDW: 14.3 % (ref 11.5–15.5)
WBC: 13.5 10*3/uL — ABNORMAL HIGH (ref 4.0–10.5)
nRBC: 0 % (ref 0.0–0.2)

## 2022-04-24 LAB — URINALYSIS, MICROSCOPIC (REFLEX): WBC, UA: >50 WBC/hpf (ref 0–5)

## 2022-04-24 LAB — URINALYSIS, ROUTINE W REFLEX MICROSCOPIC
Bilirubin Urine: NEGATIVE
Glucose, UA: NEGATIVE mg/dL
Ketones, ur: NEGATIVE mg/dL
Nitrite: NEGATIVE
Protein, ur: NEGATIVE mg/dL
Specific Gravity, Urine: <=1.005 (ref 1.005–1.030)
pH: 6.5 (ref 5.0–8.0)

## 2022-04-24 LAB — COMPREHENSIVE METABOLIC PANEL
ALT: 14 U/L (ref 0–44)
AST: 22 U/L (ref 15–41)
Albumin: 4.4 g/dL (ref 3.5–5.0)
Alkaline Phosphatase: 88 U/L (ref 38–126)
Anion gap: 14 (ref 5–15)
BUN: 11 mg/dL (ref 8–23)
CO2: 25 mmol/L (ref 22–32)
Calcium: 9.4 mg/dL (ref 8.9–10.3)
Chloride: 99 mmol/L (ref 98–111)
Creatinine, Ser: 1.01 mg/dL — ABNORMAL HIGH (ref 0.44–1.00)
GFR, Estimated: 58 mL/min — ABNORMAL LOW (ref >60)
Glucose, Bld: 107 mg/dL — ABNORMAL HIGH (ref 70–99)
Potassium: 2.9 mmol/L — ABNORMAL LOW (ref 3.5–5.1)
Sodium: 138 mmol/L (ref 135–145)
Total Bilirubin: 1.6 mg/dL — ABNORMAL HIGH (ref 0.3–1.2)
Total Protein: 7.9 g/dL (ref 6.5–8.1)

## 2022-04-24 LAB — LIPASE, BLOOD: Lipase: 32 U/L (ref 11–51)

## 2022-04-24 MED ORDER — FENTANYL CITRATE PF 50 MCG/ML IJ SOSY
50.0000 ug | PREFILLED_SYRINGE | Freq: Once | INTRAMUSCULAR | Status: AC
  Administered 2022-04-24: 50 ug via INTRAVENOUS
  Filled 2022-04-24: qty 1

## 2022-04-24 MED ORDER — IOHEXOL 300 MG/ML  SOLN
100.0000 mL | Freq: Once | INTRAMUSCULAR | Status: AC | PRN
  Administered 2022-04-24: 100 mL via INTRAVENOUS

## 2022-04-24 MED ORDER — CEPHALEXIN 500 MG PO CAPS: 500.0000 mg | ORAL_CAPSULE | Freq: Two times a day (BID) | ORAL | 0 refills | Status: AC

## 2022-04-24 MED ORDER — ONDANSETRON HCL 4 MG PO TABS: 4.0000 mg | ORAL_TABLET | Freq: Four times a day (QID) | ORAL | 0 refills | Status: DC

## 2022-04-24 MED ORDER — OXYCODONE HCL 5 MG PO TABS: 5.0000 mg | ORAL_TABLET | Freq: Four times a day (QID) | ORAL | 0 refills | Status: DC | PRN

## 2022-04-24 MED ORDER — CEPHALEXIN 250 MG PO CAPS
500.0000 mg | ORAL_CAPSULE | Freq: Once | ORAL | Status: AC
  Administered 2022-04-24: 500 mg via ORAL
  Filled 2022-04-24: qty 2

## 2022-04-24 MED ORDER — ONDANSETRON HCL 4 MG/2ML IJ SOLN
4.0000 mg | Freq: Once | INTRAMUSCULAR | Status: AC
  Administered 2022-04-24: 4 mg via INTRAVENOUS
  Filled 2022-04-24: qty 2

## 2022-04-24 MED ORDER — SODIUM CHLORIDE 0.9 % IV BOLUS
1000.0000 mL | Freq: Once | INTRAVENOUS | Status: AC
  Administered 2022-04-24: 1000 mL via INTRAVENOUS

## 2022-04-24 NOTE — ED Provider Triage Note (Signed)
Emergency Medicine Provider Triage Evaluation Note  Kathryn Pruitt , a 77 y.o. female  was evaluated in triage.  Pt complains of severe abdominal pain.  Hx of hernia surgery 05/2021 and prior abdominal hysterectomy.  No N/V, fevers, chills, constipation.  Pain radiates through to back.  Pain 8/10.  No leg weakness or difficulty walking per pt.  Review of Systems  Positive:  Negative: See above  Physical Exam  There were no vitals taken for this visit. Gen:   Awake, no distress   Resp:  Normal effort  MSK:   Moves extremities without difficulty  Other:  Sitting comfortably.  Abdomen diffusely tender, protuberant, no signifciant distension, soft.  Medical Decision Making  Medically screening exam initiated at 3:10 PM.  Appropriate orders placed.  Kathryn Pruitt was informed that the remainder of the evaluation will be completed by another provider, this initial triage assessment does not replace that evaluation, and the importance of remaining in the ED until their evaluation is complete.     Prince Rome, PA-C 83/72/90 1515

## 2022-04-24 NOTE — ED Provider Notes (Signed)
[facility] EMERGENCY DEPARTMENT Provider Note   CSN: [id number] Arrival date & time: 04/24/22  1434     History  Chief Complaint  Patient presents with   Abdominal Pain    Kathryn Pruitt is a 77 y.o. female.  Patient here with abdominal pain and, prior hysterectomy hernia surgery.  Denies any nausea or vomiting.  Denies any constipation.  Maybe some pain with urination.  Pain started today.  Nothing makes it worse or better.  Denies any chest pain or shortness of breath.  The history is provided by the patient.       Home Medications Prior to Admission medications   Medication Sig Start Date End Date Taking? Authorizing Provider  cephALEXin (KEFLEX) 500 MG capsule Take 1 capsule (500 mg total) by mouth 2 (two) times daily for 5 days. 04/24/22 04/29/22 Yes Tedra Coppernoll, DO  ondansetron (ZOFRAN) 4 MG tablet Take 1 tablet (4 mg total) by mouth every 6 (six) hours. 04/24/22  Yes Elliet Goodnow, DO  oxyCODONE (ROXICODONE) 5 MG immediate release tablet Take 1 tablet (5 mg total) by mouth every 6 (six) hours as needed for up to 10 doses. 04/24/22  Yes Aristides Luckey, DO  amoxicillin (AMOXIL) 500 MG tablet Take 2,000 mg by mouth once. ONLY WHEN GOING TO THE DENTIST 05/17/21   [provider]  celecoxib (CELEBREX) 100 MG capsule TAKE 1 CAPSULE BY MOUTH DAILY 02/25/22   Marrian Salvage, FNP  Cholecalciferol (VITAMIN D3 PO) Take 1 tablet by mouth daily.    [provider]  ezetimibe (ZETIA) 10 MG tablet Take 1 tablet (10 mg total) by mouth daily. 12/03/21   Marrian Salvage, FNP  ezetimibe (ZETIA) 10 MG tablet Take 1 tablet (10 mg total) by mouth daily. 12/04/21   Marrian Salvage, FNP  felodipine (PLENDIL) 10 MG 24 hr tablet Take 1 tablet (10 mg total) by mouth daily. 12/02/21   Marrian Salvage, FNP  fosinopril (MONOPRIL) 20 MG tablet Take 1 tablet (20 mg total) by mouth daily. 12/02/21   Marrian Salvage, FNP  hydrochlorothiazide  (MICROZIDE) 12.5 MG capsule Take 1 capsule (12.5 mg total) by mouth daily. 12/02/21   Marrian Salvage, FNP  loratadine (CLARITIN) 10 MG tablet Take 10 mg by mouth daily.    [provider]  pantoprazole (PROTONIX) 40 MG tablet Take 1 tablet (40 mg total) by mouth daily. 12/02/21   Marrian Salvage, FNP  metoCLOPramide (REGLAN) 10 MG tablet Take 10 mg by mouth 2 (two) times daily. 06/22/17 12/01/19  [provider]      Allergies    Prednisone and Statins    Review of Systems   Review of Systems  Physical Exam Updated Vital Signs BP (!) 159/105   Pulse 71   Temp 98.4 F (36.9 C) (Oral)   Resp 18   Ht '5\' 5"'$  (1.651 m)   Wt 89.6 kg   SpO2 99%   BMI 32.87 kg/m  Physical Exam Vitals and nursing note reviewed.  Constitutional:      General: She is not in acute distress.    Appearance: She is well-developed.  HENT:     Head: Normocephalic and atraumatic.  Eyes:     Conjunctiva/sclera: Conjunctivae normal.  Cardiovascular:     Rate and Rhythm: Normal rate and regular rhythm.     Heart sounds: Normal heart sounds. No murmur heard. Pulmonary:     Effort: Pulmonary effort is normal. No respiratory distress.  Breath sounds: Normal breath sounds.  Abdominal:     Palpations: Abdomen is soft.     Tenderness: There is generalized abdominal tenderness.  Musculoskeletal:        General: No swelling.     Cervical back: Neck supple.  Skin:    General: Skin is warm and dry.     Capillary Refill: Capillary refill takes less than 2 seconds.  Neurological:     Mental Status: She is alert.  Psychiatric:        Mood and Affect: Mood normal.     ED Results / Procedures / Treatments   Labs (all labs ordered are listed, but only abnormal results are displayed) Labs Reviewed  CBC WITH DIFFERENTIAL/PLATELET - Abnormal; Notable for the following components:      Result Value   WBC 13.5 (*)    RBC 5.26 (*)    Hemoglobin 15.7 (*)    Neutro Abs 8.5 (*)    All  other components within normal limits  COMPREHENSIVE METABOLIC PANEL - Abnormal; Notable for the following components:   Potassium 2.9 (*)    Glucose, Bld 107 (*)    Creatinine, Ser 1.01 (*)    Total Bilirubin 1.6 (*)    GFR, Estimated 58 (*)    All other components within normal limits  URINALYSIS, ROUTINE W REFLEX MICROSCOPIC - Abnormal; Notable for the following components:   Hgb urine dipstick TRACE (*)    Leukocytes,Ua LARGE (*)    All other components within normal limits  URINALYSIS, MICROSCOPIC (REFLEX) - Abnormal; Notable for the following components:   Bacteria, UA MANY (*)    All other components within normal limits  LIPASE, BLOOD    EKG None  Radiology CT ABDOMEN PELVIS W CONTRAST  Result Date: 04/24/2022 CLINICAL DATA:  Upper abdominal pain since this morning, radiating to back EXAM: CT ABDOMEN AND PELVIS WITH CONTRAST TECHNIQUE: Multidetector CT imaging of the abdomen and pelvis was performed using the standard protocol following bolus administration of intravenous contrast. RADIATION DOSE REDUCTION: This exam was performed according to the departmental dose-optimization program which includes automated exposure control, adjustment of the mA and/or kV according to patient size and/or use of iterative reconstruction technique. CONTRAST:  138m OMNIPAQUE IOHEXOL 300 MG/ML  SOLN COMPARISON:  01/23/2021 FINDINGS: Lower chest: No acute pleural or parenchymal lung disease. Hepatobiliary: Hepatic steatosis. No focal liver abnormality. The gallbladder is unremarkable. No biliary duct dilation. Pancreas: Unremarkable. No pancreatic ductal dilatation or surrounding inflammatory changes. Spleen: Stable splenic cyst. Otherwise normal size without additional abnormality. Adrenals/Urinary Tract: Bilateral adrenals are stable. Simple left renal cysts again noted without significant change. No specific imaging follow-up is required. Otherwise the kidneys enhance normally and symmetrically. No  urinary tract calculi or obstructive uropathy. Bladder is decompressed, limiting its evaluation. Stomach/Bowel: No bowel obstruction or ileus. Normal appendix right lower quadrant. Small hiatal hernia. No bowel wall thickening or inflammatory change. Vascular/Lymphatic: Aortic atherosclerosis. No enlarged abdominal or pelvic lymph nodes. Reproductive: Status post hysterectomy. No adnexal masses. Other: No free fluid or free intraperitoneal gas. No abdominal wall hernia. Musculoskeletal: No acute or destructive bony lesions. Bilateral hip arthroplasties. Reconstructed images demonstrate no additional findings. IMPRESSION: 1. No acute intra-abdominal or intrapelvic process. 2. Small hiatal hernia. 3. Hepatic steatosis. 4.  Aortic Atherosclerosis (ICD10-I70.0). Electronically Signed   By: MRanda NgoM.D.   On: 04/24/2022 17:45    Procedures Procedures    Medications Ordered in ED Medications  iohexol (OMNIPAQUE) 300 MG/ML solution 100 mL (100  mLs Intravenous Contrast Given 04/24/22 1727)  fentaNYL (SUBLIMAZE) injection 50 mcg (50 mcg Intravenous Given 04/24/22 2012)  sodium chloride 0.9 % bolus 1,000 mL (0 mLs Intravenous Stopped 04/24/22 2048)  ondansetron (ZOFRAN) injection 4 mg (4 mg Intravenous Given 04/24/22 2012)  cephALEXin (KEFLEX) capsule 500 mg (500 mg Oral Given 04/24/22 2015)    ED Course/ Medical Decision Making/ A&P                           Medical Decision Making Amount and/or Complexity of Data Reviewed Labs: ordered. Radiology: ordered.  Risk Prescription drug management.   Kathryn Pruitt is here with abdominal pain.  Normal vitals.  No fever.  History of hernia surgery repair hysterectomy in the past.  Differential diagnosis is bowel obstruction versus diverticulitis versus UTI versus other intra-abdominal process.  Will get CBC, CMP, lipase, urine studies, CT scan abdomen pelvis.  Will give IV fluids, IV Zofran.  Per my review and interpretation of labs is no significant  anemia or electrolyte abnormality or kidney injury.  Urinalysis appears consistent with infection.  CT scan of the abdomen pelvis shows no acute process.  Overall she is feeling better after IV fluids and IV Zofran.  Will treat for urinary tract infection.  Will prescribe antibiotics, Zofran, Roxicodone for breakthrough pain.  Discharged in good condition.  This chart was dictated using voice recognition software.  Despite best efforts to proofread,  errors can occur which can change the documentation meaning.         Final Clinical Impression(s) / ED Diagnoses Final diagnoses:  Generalized abdominal pain  Urinary tract infection without hematuria, site unspecified    Rx / DC Orders ED Discharge Orders          Ordered    oxyCODONE (ROXICODONE) 5 MG immediate release tablet  Every 6 hours PRN        04/24/22 2050    cephALEXin (KEFLEX) 500 MG capsule  2 times daily        04/24/22 2050    ondansetron (ZOFRAN) 4 MG tablet  Every 6 hours        04/24/22 2050              Lennice Sites, DO 04/24/22 2052

## 2022-04-24 NOTE — ED Triage Notes (Signed)
Upper abdominal pain that started this am and radiates to her back  Denies N/V, Denies fever   H/o henia sx 1 year ago   Provider at bedside for [facility]

## 2022-04-28 ENCOUNTER — Telehealth: Payer: Self-pay

## 2022-04-28 NOTE — Telephone Encounter (Signed)
Transition Care Management Unsuccessful Follow-up Telephone Call  Date of discharge and from where:  [facility], 04/24/2022  Attempts:  1st Attempt  Reason for unsuccessful TCM follow-up call:  No answer/busy

## 2022-04-30 NOTE — Telephone Encounter (Signed)
Transition Care Management Unsuccessful Follow-up Telephone Call  Date of discharge and from where:  [facility], 04/24/2022  Attempts:  2nd Attempt  Reason for unsuccessful TCM follow-up call:  No answer/busy

## 2022-05-01 NOTE — Telephone Encounter (Signed)
Transition Care Management Unsuccessful Follow-up Telephone Call  Date of discharge and from where:  [facility], 04/24/2022  Attempts:  3rd Attempt  Reason for unsuccessful TCM follow-up call:  No answer/busy

## 2022-05-13 ENCOUNTER — Ambulatory Visit (INDEPENDENT_AMBULATORY_CARE_PROVIDER_SITE_OTHER): Payer: Medicare Other | Admitting: Internal Medicine

## 2022-05-13 ENCOUNTER — Encounter (HOSPITAL_BASED_OUTPATIENT_CLINIC_OR_DEPARTMENT_OTHER): Payer: Self-pay | Admitting: Internal Medicine

## 2022-05-13 VITALS — BP 150/90 | HR 69 | Ht 65.0 in | Wt 193.0 lb

## 2022-05-13 DIAGNOSIS — I700 Atherosclerosis of aorta: Secondary | ICD-10-CM

## 2022-05-13 DIAGNOSIS — E7800 Pure hypercholesterolemia, unspecified: Secondary | ICD-10-CM | POA: Diagnosis not present

## 2022-05-13 NOTE — Progress Notes (Signed)
LIPID CLINIC CONSULT NOTE  Chief Complaint:  Manage dyslipidemia  Primary Care Physician: Kathryn Salvage, FNP  Primary Cardiologist:  None  HPI:  Kathryn Pruitt is a 77 y.o. female who is being seen today for the evaluation of dyslipidemia at the request of Kathryn Pruitt,*. This is a pleasant 77 year old female kindly referred for evaluation management of dyslipidemia.  She has a history of high cholesterol which has been rising.  Her LDL about 8 years ago was 129 then 169 and now up to 217.  She reports not being very active.  In the past she was on rosuvastatin which was prescribed by [facility] medical.  She had side effects including myalgias with that.  Subsequently she was recommended to start ezetimibe.  She is likely been on that for about 6 months since her last lab work.  She has not had repeat labs since then.  She does get some muscle aches but is not clear that it is much worse on the Zetia than her baseline.  She does report that her brother had died of heart attack, although it is not clear that there is high cholesterol heart disease and other family members but both of her parents are deceased.  She was noted on abdominal imaging in January of this year to have both hepatic steatosis and aortic atherosclerosis.  PMHx:  Past Medical History:  Diagnosis Date   Arthritis    Hypertension     Past Surgical History:  Procedure Laterality Date   ABDOMINAL HYSTERECTOMY  1987   FOOT SURGERY Bilateral 04/20/2000   HERNIA REPAIR     TOTAL HIP ARTHROPLASTY Bilateral 04/20/2006   WISDOM TOOTH EXTRACTION  1986   XI ROBOTIC ASSISTED VENTRAL HERNIA N/A 06/06/2021   Procedure: ROBOTIC INCISIONAL HERNIA REPAIR WITH MESH;  Surgeon: Stechschulte, Nickola Major, MD;  Location: [facility];  Service: General;  Laterality: N/A;    FAMHx:  Family History  Problem Relation Age of Onset   Lung cancer Mother    Hypertension Mother    Hypertension Brother    Bone cancer Maternal  Grandmother     SOCHx:   reports that she quit smoking about 26 years ago. Her smoking use included cigarettes. She has a 10.00 pack-year smoking history. She has never used smokeless tobacco. She reports that she does not currently use alcohol. She reports that she does not currently use drugs after having used the following drugs: Marijuana.  ALLERGIES:  Allergies  Allergen Reactions   Prednisone Hives   Statins Other (See Comments)    Myalgia    ROS: Pertinent items noted in HPI and remainder of comprehensive ROS otherwise negative.  HOME MEDS: Current Outpatient Medications on File Prior to Visit  Medication Sig Dispense Refill   amoxicillin (AMOXIL) 500 MG tablet Take 2,000 mg by mouth once. ONLY WHEN GOING TO THE DENTIST     celecoxib (CELEBREX) 100 MG capsule TAKE 1 CAPSULE BY MOUTH DAILY 90 capsule 0   Cholecalciferol (VITAMIN D3 PO) Take 1 tablet by mouth daily.     ezetimibe (ZETIA) 10 MG tablet Take 1 tablet (10 mg total) by mouth daily. 90 tablet 3   felodipine (PLENDIL) 10 MG 24 hr tablet Take 1 tablet (10 mg total) by mouth daily. 90 tablet 3   fosinopril (MONOPRIL) 20 MG tablet Take 1 tablet (20 mg total) by mouth daily. 90 tablet 3   hydrochlorothiazide (MICROZIDE) 12.5 MG capsule Take 1 capsule (12.5 mg total) by mouth daily.  90 capsule 3   loratadine (CLARITIN) 10 MG tablet Take 10 mg by mouth daily.     oxyCODONE (ROXICODONE) 5 MG immediate release tablet Take 1 tablet (5 mg total) by mouth every 6 (six) hours as needed for up to 10 doses. 10 tablet 0   pantoprazole (PROTONIX) 40 MG tablet Take 1 tablet (40 mg total) by mouth daily. 90 tablet 3   [DISCONTINUED] metoCLOPramide (REGLAN) 10 MG tablet Take 10 mg by mouth 2 (two) times daily.     Current Facility-Administered Medications on File Prior to Visit  Medication Dose Route Frequency Provider Last Rate Last Admin   cyanocobalamin ((VITAMIN B-12)) injection 1,000 mcg  1,000 mcg Intramuscular Q30 days Kathryn Salvage, FNP   1,000 mcg at 12/11/20 1033    LABS/IMAGING: No results found for this or any previous visit (from the past 29 hour(s)). No results found.  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   Kathryn Pruitt has been tolerating ezetimibe now for about 6 months.  Will plan a more an LP(a) assessment now.  Based on these findings, will likely need additional therapies.  We may need to retrial a statin because she is only previously taken 1 to see if it is tolerated.  Otherwise want to consider injectable therapies such as PCSK9 inhibitor.  Plan follow-up with me ultimately about 3 to 4 months.  Thanks as always for the kind referral.  Pixie Casino, MD, FACC, [place] Director of the [facility]  [facility] Diplomate of the American Board of Clinical Lipidology Attending Cardiologist  Direct Dial: [phone]  Fax: [phone]  Website:  [email or web address] 05/13/2022, 9:49 AM

## 2022-05-13 NOTE — Patient Instructions (Signed)
Medication Instructions:  NO CHANGES today -- will depend on lab results  *If you need a refill on your cardiac medications before your next appointment, please call your pharmacy*   Lab Work: NMR lipoprofile, LPa today   If you have labs (blood work) drawn today and your tests are completely normal, you will receive your results only by: [place] (if you have MyChart) OR A paper copy in the mail If you have any lab test that is abnormal or we need to change your treatment, we will call you to review the results.   Follow-Up: At [facility], you and your health needs are our priority.  As part of our continuing mission to provide you with exceptional heart care, we have created designated Provider Care Teams.  These Care Teams include your primary Cardiologist (physician) and Advanced Practice Providers (APPs -  Physician Assistants and Nurse Practitioners) who all work together to provide you with the care you need, when you need it.  We recommend signing up for the patient portal called "MyChart".  Sign up information is provided on this After Visit Summary.  MyChart is used to connect with patients for Virtual Visits (Telemedicine).  Patients are able to view lab/test results, encounter notes, upcoming appointments, etc.  Non-urgent messages can be sent to your provider as well.   To learn more about what you can do with MyChart, go to [email or web address].    Your next appointment:    4 months with Dr. Debara Pickett

## 2022-05-14 LAB — NMR, LIPOPROFILE
Cholesterol, Total: 229 mg/dL — ABNORMAL HIGH (ref 100–199)
HDL Particle Number: 41.7 umol/L (ref >=30.5)
HDL-C: 58 mg/dL (ref >39)
LDL Particle Number: 1766 nmol/L — ABNORMAL HIGH (ref <1000)
LDL Size: 21 nm (ref >20.5)
LDL-C (NIH Calc): 150 mg/dL — ABNORMAL HIGH (ref 0–99)
LP-IR Score: 70 — ABNORMAL HIGH (ref <=45)
Small LDL Particle Number: 651 nmol/L — ABNORMAL HIGH (ref <=527)
Triglycerides: 118 mg/dL (ref 0–149)

## 2022-05-14 LAB — LIPOPROTEIN A (LPA): Lipoprotein (a): 128.5 nmol/L — ABNORMAL HIGH (ref <75.0)

## 2022-05-26 ENCOUNTER — Other Ambulatory Visit: Payer: Self-pay | Admitting: Family

## 2022-05-26 ENCOUNTER — Telehealth: Payer: Self-pay | Admitting: Family

## 2022-05-26 NOTE — Telephone Encounter (Signed)
She was referred to endocrinology after her ultrasound last August. Has she been able to establish with anyone? This is an important issue that she needs to make sure she follows up on as requested.

## 2022-06-10 NOTE — Telephone Encounter (Signed)
Called patient on 06/10/22 She would like to pursue Leqvio  Portal enrollment for benefits check completed on 2/21 - patient aware she will get an email from [email or web address] to sign off on Advised will call her back with info once benefits check paperwork is done

## 2022-06-10 NOTE — Telephone Encounter (Signed)
Can you provide an update on this referral please?

## 2022-06-16 ENCOUNTER — Telehealth: Payer: Self-pay | Admitting: Internal Medicine

## 2022-06-16 DIAGNOSIS — E7841 Elevated Lipoprotein(a): Secondary | ICD-10-CM

## 2022-06-16 DIAGNOSIS — E7800 Pure hypercholesterolemia, unspecified: Secondary | ICD-10-CM

## 2022-06-16 NOTE — Telephone Encounter (Signed)
Patient has been identified as candidate for Leqvio  Benefits investigation enrollment completed on 06/16/22  Benefits investigation report notes the following:  Type of insurance: Medicare/Tricare  OOP Max: None / Met: N/A  Deductible: $240  Co-insurance: 20%  PA required: NO  PA phone number: N/A  Benefits Summary Details:  Primary: Medicare Part B covers Leqvio at 80% after $240 calendar year deductible.  Secondary: [employer] [place] Medicare Advantage - Marion Downer is covered at 100% - $0 OOP responsibility

## 2022-06-23 ENCOUNTER — Encounter: Payer: Self-pay | Admitting: Internal Medicine

## 2022-06-23 DIAGNOSIS — E7841 Elevated Lipoprotein(a): Secondary | ICD-10-CM | POA: Insufficient documentation

## 2022-06-23 NOTE — Addendum Note (Signed)
Addended by: Fidel Levy on: 06/23/2022 10:23 AM   Modules accepted: Orders

## 2022-06-23 NOTE — Telephone Encounter (Signed)
Spoke with patient about Leqvio. Answered questions about frequency of administration, possible side effects, length of therapy (lifetime, if tolerated), why she needs LDL lowering, risk factors and how they + co-morbidities determine LDL goal. Patient decided she will proceed with therapy. Moved her June appt to July to allow enough time for intitial 2 doses + labs. She is aware the infusion center will be calling her next for an appointment.

## 2022-06-24 NOTE — Telephone Encounter (Signed)
Kathryn Pruitt, Patient will be scheduled as soon as possible.  Auth Submission: NO AUTH NEEDED Payer: medicare a/b & tricare Medication & CPT/J Code(s) submitted: Leqvio (Inclisiran) J1306 Route of submission (phone, fax, portal):  Phone # Fax # Auth type: Buy/Bill Units/visits requested: x3 doses Reference number:  Approval from: 06/24/22 to 06/24/23   Medicare will cover 80% and Tricare will pick-up remaining 20%

## 2022-06-25 NOTE — Telephone Encounter (Signed)
First Leqvio injection 07/03/22

## 2022-07-02 ENCOUNTER — Other Ambulatory Visit: Payer: Self-pay | Admitting: Family

## 2022-07-02 MED ORDER — CELECOXIB 100 MG PO CAPS: 100.0000 mg | ORAL_CAPSULE | Freq: Every day | ORAL | 0 refills | Status: DC

## 2022-07-03 ENCOUNTER — Ambulatory Visit (INDEPENDENT_AMBULATORY_CARE_PROVIDER_SITE_OTHER): Payer: Medicare Other | Admitting: *Deleted

## 2022-07-03 VITALS — BP 151/87 | HR 60 | Temp 97.7°F | Resp 12 | Ht 65.0 in | Wt 191.2 lb

## 2022-07-03 DIAGNOSIS — E7841 Elevated Lipoprotein(a): Secondary | ICD-10-CM | POA: Diagnosis not present

## 2022-07-03 DIAGNOSIS — E782 Mixed hyperlipidemia: Secondary | ICD-10-CM

## 2022-07-03 MED ORDER — INCLISIRAN SODIUM 284 MG/1.5ML ~~LOC~~ SOSY
284.0000 mg | PREFILLED_SYRINGE | Freq: Once | SUBCUTANEOUS | Status: AC
  Administered 2022-07-03: 284 mg via SUBCUTANEOUS
  Filled 2022-07-03: qty 1.5

## 2022-07-03 NOTE — Progress Notes (Signed)
Diagnosis: Hyperlipidemia  Provider:  Praveen Mannam MD  Procedure: Injection  Leqvio (inclisiran), Dose: 284 mg, Site: subcutaneous, Number of injections: 1  Post Care: Observation period completed  Discharge: Condition: Good, Destination: Home . AVS Provided  Performed by:  Tangela Dolliver A, RN       

## 2022-07-17 ENCOUNTER — Other Ambulatory Visit: Payer: Self-pay | Admitting: Family

## 2022-07-20 MED ORDER — CELECOXIB 100 MG PO CAPS: 100.0000 mg | ORAL_CAPSULE | Freq: Every day | ORAL | 0 refills | Status: DC

## 2022-08-04 ENCOUNTER — Telehealth: Payer: Self-pay | Admitting: Family

## 2022-08-04 NOTE — Telephone Encounter (Signed)
Contacted Keshana Shimabukuro to schedule their annual wellness visit. Appointment made for 08/17/2022.  Verlee Rossetti; Care Guide Ambulatory Clinical Support [place] [facility] Direct Dial: [phone]

## 2022-08-17 ENCOUNTER — Ambulatory Visit (INDEPENDENT_AMBULATORY_CARE_PROVIDER_SITE_OTHER): Payer: Medicare Other | Admitting: *Deleted

## 2022-08-17 DIAGNOSIS — Z780 Asymptomatic menopausal state: Secondary | ICD-10-CM

## 2022-08-17 DIAGNOSIS — Z0000 Encounter for general adult medical examination without abnormal findings: Secondary | ICD-10-CM

## 2022-08-17 NOTE — Patient Instructions (Signed)
Kathryn Pruitt , Thank you for taking time to come for your Medicare Wellness Visit. I appreciate your ongoing commitment to your health goals. Please review the following plan we discussed and let me know if I can assist you in the future.      This is a list of the screening recommended for you and due dates:  Health Maintenance  Topic Date Due   COVID-19 Vaccine (1) Never done   Hepatitis C Screening: USPSTF Recommendation to screen - Ages 46-75 yo.  Never done   DTaP/Tdap/Td vaccine (1 - Tdap) Never done   Zoster (Shingles) Vaccine (1 of 2) Never done   Pneumonia Vaccine (1 of 1 - PCV) Never done   Flu Shot  11/19/2022   Medicare Annual Wellness Visit  08/17/2023   Mammogram  08/23/2023   Colon Cancer Screening  04/20/2030   DEXA scan (bone density measurement)  Addressed   HPV Vaccine  Aged Out    Next appointment: Follow up in one year for your annual wellness visit.   Preventive Care 70 Years and Older, Female Preventive care refers to lifestyle choices and visits with your health care provider that can promote health and wellness. What does preventive care include? A yearly physical exam. This is also called an annual well check. Dental exams once or twice a year. Routine eye exams. Ask your health care provider how often you should have your eyes checked. Personal lifestyle choices, including: Daily care of your teeth and gums. Regular physical activity. Eating a healthy diet. Avoiding tobacco and drug use. Limiting alcohol use. Practicing safe sex. Taking low-dose aspirin every day. Taking vitamin and mineral supplements as recommended by your health care provider. What happens during an annual well check? The services and screenings done by your health care provider during your annual well check will depend on your age, overall health, lifestyle risk factors, and family history of disease. Counseling  Your health care provider may ask you questions about  your: Alcohol use. Tobacco use. Drug use. Emotional well-being. Home and relationship well-being. Sexual activity. Eating habits. History of falls. Memory and ability to understand (cognition). Work and work Astronomer. Reproductive health. Screening  You may have the following tests or measurements: Height, weight, and BMI. Blood pressure. Lipid and cholesterol levels. These may be checked every 5 years, or more frequently if you are over 50 years old. Skin check. Lung cancer screening. You may have this screening every year starting at age 40 if you have a 30-pack-year history of smoking and currently smoke or have quit within the past 15 years. Fecal occult blood test (FOBT) of the stool. You may have this test every year starting at age 20. Flexible sigmoidoscopy or colonoscopy. You may have a sigmoidoscopy every 5 years or a colonoscopy every 10 years starting at age 85. Hepatitis C blood test. Hepatitis B blood test. Sexually transmitted disease (STD) testing. Diabetes screening. This is done by checking your blood sugar (glucose) after you have not eaten for a while (fasting). You may have this done every 1-3 years. Bone density scan. This is done to screen for osteoporosis. You may have this done starting at age 10. Mammogram. This may be done every 1-2 years. Talk to your health care provider about how often you should have regular mammograms. Talk with your health care provider about your test results, treatment options, and if necessary, the need for more tests. Vaccines  Your health care provider may recommend certain vaccines, such as:  Influenza vaccine. This is recommended every year. Tetanus, diphtheria, and acellular pertussis (Tdap, Td) vaccine. You may need a Td booster every 10 years. Zoster vaccine. You may need this after age 48. Pneumococcal 13-valent conjugate (PCV13) vaccine. One dose is recommended after age 3. Pneumococcal polysaccharide (PPSV23) vaccine.  One dose is recommended after age 63. Talk to your health care provider about which screenings and vaccines you need and how often you need them. This information is not intended to replace advice given to you by your health care provider. Make sure you discuss any questions you have with your health care provider. Document Released: 05/03/2015 Document Revised: 12/25/2015 Document Reviewed: 02/05/2015 Elsevier Interactive Patient Education  2017 [employer] Prevention in the Home Falls can cause injuries. They can happen to people of all ages. There are many things you can do to make your home safe and to help prevent falls. What can I do on the outside of my home? Regularly fix the edges of walkways and driveways and fix any cracks. Remove anything that might make you trip as you walk through a door, such as a raised step or threshold. Trim any bushes or trees on the path to your home. Use bright outdoor lighting. Clear any walking paths of anything that might make someone trip, such as rocks or tools. Regularly check to see if handrails are loose or broken. Make sure that both sides of any steps have handrails. Any raised decks and porches should have guardrails on the edges. Have any leaves, snow, or ice cleared regularly. Use sand or salt on walking paths during winter. Clean up any spills in your garage right away. This includes oil or grease spills. What can I do in the bathroom? Use night lights. Install grab bars by the toilet and in the tub and shower. Do not use towel bars as grab bars. Use non-skid mats or decals in the tub or shower. If you need to sit down in the shower, use a plastic, non-slip stool. Keep the floor dry. Clean up any water that spills on the floor as soon as it happens. Remove soap buildup in the tub or shower regularly. Attach bath mats securely with double-sided non-slip rug tape. Do not have throw rugs and other things on the floor that can make  you trip. What can I do in the bedroom? Use night lights. Make sure that you have a light by your bed that is easy to reach. Do not use any sheets or blankets that are too big for your bed. They should not hang down onto the floor. Have a firm chair that has side arms. You can use this for support while you get dressed. Do not have throw rugs and other things on the floor that can make you trip. What can I do in the kitchen? Clean up any spills right away. Avoid walking on wet floors. Keep items that you use a lot in easy-to-reach places. If you need to reach something above you, use a strong step stool that has a grab bar. Keep electrical cords out of the way. Do not use floor polish or wax that makes floors slippery. If you must use wax, use non-skid floor wax. Do not have throw rugs and other things on the floor that can make you trip. What can I do with my stairs? Do not leave any items on the stairs. Make sure that there are handrails on both sides of the stairs and use them.  Fix handrails that are broken or loose. Make sure that handrails are as long as the stairways. Check any carpeting to make sure that it is firmly attached to the stairs. Fix any carpet that is loose or worn. Avoid having throw rugs at the top or bottom of the stairs. If you do have throw rugs, attach them to the floor with carpet tape. Make sure that you have a light switch at the top of the stairs and the bottom of the stairs. If you do not have them, ask someone to add them for you. What else can I do to help prevent falls? Wear shoes that: Do not have high heels. Have rubber bottoms. Are comfortable and fit you well. Are closed at the toe. Do not wear sandals. If you use a stepladder: Make sure that it is fully opened. Do not climb a closed stepladder. Make sure that both sides of the stepladder are locked into place. Ask someone to hold it for you, if possible. Clearly mark and make sure that you can  see: Any grab bars or handrails. First and last steps. Where the edge of each step is. Use tools that help you move around (mobility aids) if they are needed. These include: Canes. Walkers. Scooters. Crutches. Turn on the lights when you go into a dark area. Replace any light bulbs as soon as they burn out. Set up your furniture so you have a clear path. Avoid moving your furniture around. If any of your floors are uneven, fix them. If there are any pets around you, be aware of where they are. Review your medicines with your doctor. Some medicines can make you feel dizzy. This can increase your chance of falling. Ask your doctor what other things that you can do to help prevent falls. This information is not intended to replace advice given to you by your health care provider. Make sure you discuss any questions you have with your health care provider. Document Released: 01/31/2009 Document Revised: 09/12/2015 Document Reviewed: 05/11/2014 Elsevier Interactive Patient Education  2017 [employer].

## 2022-08-17 NOTE — Progress Notes (Signed)
Subjective:   Kathryn Pruitt is a 77 y.o. female who presents for Medicare Annual (Subsequent) preventive examination.  I connected with  Kathryn Pruitt on 08/17/22 by a audio enabled telemedicine application and verified that I am speaking with the correct person using two identifiers.  Patient Location: Home  Provider Location: Office/Clinic  I discussed the limitations of evaluation and management by telemedicine. The patient expressed understanding and agreed to proceed.   Review of Systems     Cardiac Risk Factors include: advanced age (>49men, >16 women);dyslipidemia;hypertension     Objective:    There were no vitals filed for this visit. There is no height or weight on file to calculate BMI.     08/17/2022    9:42 AM 04/24/2022    3:12 PM 08/07/2021   10:30 AM 06/06/2021    1:27 PM 05/28/2021    9:45 AM  Advanced Directives  Does Patient Have a Medical Advance Directive? No No No No No  Would patient like information on creating a medical advance directive? No - Patient declined  No - Patient declined No - Patient declined Yes (MAU/Ambulatory/Procedural Areas - Information given)    Current Medications (verified) Outpatient Encounter Medications as of 08/17/2022  Medication Sig   amoxicillin (AMOXIL) 500 MG tablet Take 2,000 mg by mouth once. ONLY WHEN GOING TO THE DENTIST   celecoxib (CELEBREX) 100 MG capsule Take 1 capsule (100 mg total) by mouth daily.   Cholecalciferol (VITAMIN D3 PO) Take 1 tablet by mouth daily.   ezetimibe (ZETIA) 10 MG tablet Take 1 tablet (10 mg total) by mouth daily.   felodipine (PLENDIL) 10 MG 24 hr tablet Take 1 tablet (10 mg total) by mouth daily.   fosinopril (MONOPRIL) 20 MG tablet Take 1 tablet (20 mg total) by mouth daily.   hydrochlorothiazide (MICROZIDE) 12.5 MG capsule Take 1 capsule (12.5 mg total) by mouth daily.   loratadine (CLARITIN) 10 MG tablet Take 10 mg by mouth daily.   oxyCODONE (ROXICODONE) 5 MG immediate release tablet  Take 1 tablet (5 mg total) by mouth every 6 (six) hours as needed for up to 10 doses.   pantoprazole (PROTONIX) 40 MG tablet Take 1 tablet (40 mg total) by mouth daily.   [DISCONTINUED] metoCLOPramide (REGLAN) 10 MG tablet Take 10 mg by mouth 2 (two) times daily.   Facility-Administered Encounter Medications as of 08/17/2022  Medication   cyanocobalamin ((VITAMIN B-12)) injection 1,000 mcg    Allergies (verified) Prednisone and Statins   History: Past Medical History:  Diagnosis Date   Arthritis    Hypertension    Past Surgical History:  Procedure Laterality Date   ABDOMINAL HYSTERECTOMY  1987   FOOT SURGERY Bilateral 04/20/2000   HERNIA REPAIR     TOTAL HIP ARTHROPLASTY Bilateral 04/20/2006   WISDOM TOOTH EXTRACTION  1986   XI ROBOTIC ASSISTED VENTRAL HERNIA N/A 06/06/2021   Procedure: ROBOTIC INCISIONAL HERNIA REPAIR WITH MESH;  Surgeon: Stechschulte, Hyman Hopes, MD;  Location: [facility];  Service: General;  Laterality: N/A;   Family History  Problem Relation Age of Onset   Lung cancer Mother    Hypertension Mother    Hypertension Brother    Bone cancer Maternal Grandmother    Social History   Socioeconomic History   Marital status: Married    Spouse name: Not on file   Number of children: Not on file   Years of education: Not on file   Highest education level: Not on file  Occupational History  Not on file  Tobacco Use   Smoking status: Former    Packs/day: 0.50    Years: 20.00    Additional pack years: 0.00    Total pack years: 10.00    Types: Cigarettes    Quit date: 50    Years since quitting: 26.3   Smokeless tobacco: Never  Vaping Use   Vaping Use: Never used  Substance and Sexual Activity   Alcohol use: Not Currently   Drug use: Not Currently    Types: Marijuana   Sexual activity: Not on file  Other Topics Concern   Not on file  Social History Narrative   Not on file   Social Determinants of Health   Financial Resource Strain: Low Risk   (08/07/2021)   Overall Financial Resource Strain (CARDIA)    Difficulty of Paying Living Expenses: Not hard at all  Food Insecurity: No Food Insecurity (08/17/2022)   Hunger Vital Sign    Worried About Running Out of Food in the Last Year: Never true    Ran Out of Food in the Last Year: Never true  Transportation Needs: No Transportation Needs (08/17/2022)   PRAPARE - Administrator, Civil Service (Medical): No    Lack of Transportation (Non-Medical): No  Physical Activity: Inactive (08/07/2021)   Exercise Vital Sign    Days of Exercise per Week: 0 days    Minutes of Exercise per Session: 0 min  Stress: No Stress Concern Present (08/07/2021)   [employer] of Occupational Health - Occupational Stress Questionnaire    Feeling of Stress : Not at all  Social Connections: Moderately Isolated (08/07/2021)   Social Connection and Isolation Panel [NHANES]    Frequency of Communication with Friends and Family: More than three times a week    Frequency of Social Gatherings with Friends and Family: More than three times a week    Attends Religious Services: Never    Database administrator or Organizations: No    Attends Engineer, structural: Never    Marital Status: Married    Tobacco Counseling Counseling given: Not Answered   Clinical Intake:  Pre-visit preparation completed: Yes  Pain : No/denies pain  Nutritional Risks: None Diabetes: No  How often do you need to have someone help you when you read instructions, pamphlets, or other written materials from your doctor or pharmacy?: 1 - Never  Activities of Daily Living    08/17/2022    9:46 AM  In your present state of health, do you have any difficulty performing the following activities:  Hearing? 0  Vision? 0  Difficulty concentrating or making decisions? 0  Walking or climbing stairs? 0  Dressing or bathing? 0  Doing errands, shopping? 0  Preparing Food and eating ? N  Using the Toilet? N  In the  past six months, have you accidently leaked urine? Y  Do you have problems with loss of bowel control? N  Managing your Medications? N  Managing your Finances? N  Housekeeping or managing your Housekeeping? N    Patient Care Team: Olive Bass, FNP as PCP - General (Internal Medicine)  Indicate any recent Medical Services you may have received from other than [facility] providers in the past year (date may be approximate).     Assessment:   This is a routine wellness examination for Aiyannah.  Hearing/Vision screen No results found.  Dietary issues and exercise activities discussed: Current Exercise Habits: The patient does not participate in regular  exercise at present, Exercise limited by: orthopedic condition(s)   Goals Addressed   None    Depression Screen    08/17/2022    9:45 AM 12/02/2021   10:43 AM 08/07/2021   10:22 AM 01/21/2021    9:39 AM 12/05/2020   10:02 AM 07/16/2014   11:37 AM  PHQ 2/9 Scores  PHQ - 2 Score 0 0 1 0 0 0    Fall Risk    08/17/2022    9:44 AM 12/02/2021   10:43 AM 08/07/2021   10:30 AM 01/21/2021    9:39 AM 12/05/2020   10:02 AM  Fall Risk   Falls in the past year? 0 0 0 0 1  Number falls in past yr: 0 0 0 0 0  Injury with Fall? 0 0 0 0 0  Risk for fall due to : No Fall Risks No Fall Risks No Fall Risks No Fall Risks   Follow up Falls evaluation completed Falls evaluation completed Falls prevention discussed Falls evaluation completed     FALL RISK PREVENTION PERTAINING TO THE HOME:  Any stairs in or around the home? Yes  If so, are there any without handrails? No  Home free of loose throw rugs in walkways, pet beds, electrical cords, etc? Yes  Adequate lighting in your home to reduce risk of falls? Yes   ASSISTIVE DEVICES UTILIZED TO PREVENT FALLS:  Life alert? No  Use of a cane, [place] or w/c? Yes  Grab bars in the bathroom? No  Shower chair or bench in shower? No  Elevated toilet seat or a handicapped toilet? Yes   TIMED UP  AND GO:  Was the test performed?  No, audio visit .    Cognitive Function:        08/17/2022    9:54 AM 08/07/2021   10:33 AM  6CIT Screen  What Year? 0 points 0 points  What month? 0 points 0 points  What time? 0 points 0 points  Count back from 20 0 points 0 points  Months in reverse 0 points 0 points  Repeat phrase 0 points 0 points  Total Score 0 points 0 points    Immunizations Immunization History  Administered Date(s) Administered   Fluad Quad(high Dose 65+) 01/21/2021    TDAP status: Due, Education has been provided regarding the importance of this vaccine. Advised may receive this vaccine at local pharmacy or Health Dept. Aware to provide a copy of the vaccination record if obtained from local pharmacy or Health Dept. Verbalized acceptance and understanding.  Flu Vaccine status: Up to date  Pneumococcal vaccine status: Due, Education has been provided regarding the importance of this vaccine. Advised may receive this vaccine at local pharmacy or Health Dept. Aware to provide a copy of the vaccination record if obtained from local pharmacy or Health Dept. Verbalized acceptance and understanding.  Covid-19 vaccine status: Information provided on how to obtain vaccines.   Qualifies for Shingles Vaccine? Yes   Zostavax completed No   Shingrix Completed?: No.    Education has been provided regarding the importance of this vaccine. Patient has been advised to call insurance company to determine out of pocket expense if they have not yet received this vaccine. Advised may also receive vaccine at local pharmacy or Health Dept. Verbalized acceptance and understanding.  Screening Tests Health Maintenance  Topic Date Due   COVID-19 Vaccine (1) Never done   Hepatitis C Screening  Never done   DTaP/Tdap/Td (1 - Tdap) Never done  Zoster Vaccines- Shingrix (1 of 2) Never done   Pneumonia Vaccine 56+ Years old (1 of 1 - PCV) Never done   Medicare Annual Wellness (AWV)   08/08/2022   INFLUENZA VACCINE  11/19/2022   MAMMOGRAM  08/23/2023   COLONOSCOPY (Pts 45-56yrs Insurance coverage will need to be confirmed)  04/20/2030   DEXA SCAN  Addressed   HPV VACCINES  Aged Out    Health Maintenance  Health Maintenance Due  Topic Date Due   COVID-19 Vaccine (1) Never done   Hepatitis C Screening  Never done   DTaP/Tdap/Td (1 - Tdap) Never done   Zoster Vaccines- Shingrix (1 of 2) Never done   Pneumonia Vaccine 110+ Years old (1 of 1 - PCV) Never done   Medicare Annual Wellness (AWV)  08/08/2022    Colorectal cancer screening: Type of screening: Colonoscopy. Completed 04/20/20. Repeat every 10 years  Mammogram status: Completed 08/22/21. Repeat every year  Bone Density status: Ordered 08/17/22. Pt provided with contact info and advised to call to schedule appt.  Lung Cancer Screening: (Low Dose CT Chest recommended if Age 65-80 years, 30 pack-year currently smoking OR have quit w/in 15years.) does not qualify.    Additional Screening:  Hepatitis C Screening: does qualify; Completed N/a  Vision Screening: Recommended annual ophthalmology exams for early detection of glaucoma and other disorders of the eye. Is the patient up to date with their annual eye exam?  Yes  Who is the provider or what is the name of the office in which the patient attends annual eye exams? Can't remember name at this time If pt is not established with a provider, would they like to be referred to a provider to establish care? No .   Dental Screening: Recommended annual dental exams for proper oral hygiene  Community Resource Referral / Chronic Care Management: CRR required this visit?  No   CCM required this visit?  No      Plan:     I have personally reviewed and noted the following in the patient's chart:   Medical and social history Use of alcohol, tobacco or illicit drugs  Current medications and supplements including opioid prescriptions. Patient is currently taking  opioid prescriptions. Information provided to patient regarding non-opioid alternatives. Patient advised to discuss non-opioid treatment plan with their provider. Functional ability and status Nutritional status Physical activity Advanced directives List of other physicians Hospitalizations, surgeries, and ER visits in previous 12 months Vitals Screenings to include cognitive, depression, and falls Referrals and appointments  In addition, I have reviewed and discussed with patient certain preventive protocols, quality metrics, and best practice recommendations. A written personalized care plan for preventive services as well as general preventive health recommendations were provided to patient.   Due to this being a telephonic visit, the after visit summary with patients personalized plan was offered to patient via mail or my-chart. Patient would like to access on my-chart.   Donne Anon, [place]   08/17/2022   Nurse Notes: None

## 2022-08-21 ENCOUNTER — Encounter: Payer: Self-pay | Admitting: Family

## 2022-08-21 ENCOUNTER — Ambulatory Visit (INDEPENDENT_AMBULATORY_CARE_PROVIDER_SITE_OTHER): Payer: Medicare Other | Admitting: Family

## 2022-08-21 VITALS — BP 112/62 | HR 65 | Ht 65.0 in | Wt 190.0 lb

## 2022-08-21 DIAGNOSIS — R7309 Other abnormal glucose: Secondary | ICD-10-CM

## 2022-08-21 DIAGNOSIS — E042 Nontoxic multinodular goiter: Secondary | ICD-10-CM

## 2022-08-21 DIAGNOSIS — R109 Unspecified abdominal pain: Secondary | ICD-10-CM

## 2022-08-21 DIAGNOSIS — I10 Essential (primary) hypertension: Secondary | ICD-10-CM

## 2022-08-21 DIAGNOSIS — R7989 Other specified abnormal findings of blood chemistry: Secondary | ICD-10-CM | POA: Diagnosis not present

## 2022-08-21 DIAGNOSIS — Z87440 Personal history of urinary (tract) infections: Secondary | ICD-10-CM

## 2022-08-21 LAB — COMPREHENSIVE METABOLIC PANEL
ALT: 20 U/L (ref 0–35)
AST: 22 U/L (ref 0–37)
Albumin: 4.3 g/dL (ref 3.5–5.2)
Alkaline Phosphatase: 88 U/L (ref 39–117)
BUN: 14 mg/dL (ref 6–23)
CO2: 29 mEq/L (ref 19–32)
Calcium: 9.6 mg/dL (ref 8.4–10.5)
Chloride: 103 mEq/L (ref 96–112)
Creatinine, Ser: 0.94 mg/dL (ref 0.40–1.20)
GFR: 59.68 mL/min — ABNORMAL LOW (ref >60.00)
Glucose, Bld: 97 mg/dL (ref 70–99)
Potassium: 4.1 mEq/L (ref 3.5–5.1)
Sodium: 141 mEq/L (ref 135–145)
Total Bilirubin: 0.7 mg/dL (ref 0.2–1.2)
Total Protein: 7 g/dL (ref 6.0–8.3)

## 2022-08-21 LAB — VITAMIN B12: Vitamin B-12: >1500 pg/mL — ABNORMAL HIGH (ref 211–911)

## 2022-08-21 LAB — CBC WITH DIFFERENTIAL/PLATELET
Basophils Absolute: 0 10*3/uL (ref 0.0–0.1)
Basophils Relative: 0.4 % (ref 0.0–3.0)
Eosinophils Absolute: 0.1 10*3/uL (ref 0.0–0.7)
Eosinophils Relative: 1.8 % (ref 0.0–5.0)
HCT: 44.4 % (ref 36.0–46.0)
Hemoglobin: 14.9 g/dL (ref 12.0–15.0)
Lymphocytes Relative: 39.1 % (ref 12.0–46.0)
Lymphs Abs: 2.9 10*3/uL (ref 0.7–4.0)
MCHC: 33.6 g/dL (ref 30.0–36.0)
MCV: 91.9 fl (ref 78.0–100.0)
Monocytes Absolute: 0.5 10*3/uL (ref 0.1–1.0)
Monocytes Relative: 6.4 % (ref 3.0–12.0)
Neutro Abs: 3.8 10*3/uL (ref 1.4–7.7)
Neutrophils Relative %: 52.3 % (ref 43.0–77.0)
Platelets: 306 10*3/uL (ref 150.0–400.0)
RBC: 4.83 Mil/uL (ref 3.87–5.11)
RDW: 14.8 % (ref 11.5–15.5)
WBC: 7.3 10*3/uL (ref 4.0–10.5)

## 2022-08-21 LAB — HEMOGLOBIN A1C: Hgb A1c MFr Bld: 6.1 % (ref 4.6–6.5)

## 2022-08-21 MED ORDER — PANTOPRAZOLE SODIUM 40 MG PO TBEC: 40.0000 mg | DELAYED_RELEASE_TABLET | Freq: Every day | ORAL | 3 refills | Status: DC

## 2022-08-21 NOTE — Progress Notes (Signed)
Kathryn Pruitt is a 77 y.o. 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   Osteoarthrosis, pelvic region and thigh 08/06/2021   Incisional hernia 06/06/2021   Acquired absence of genital organ 03/03/2021   Acute cholecystitis 03/03/2021   Allergic rhinitis 03/03/2021   Cellulitis and abscess of leg 03/03/2021   Symptomatic menopausal or female climacteric states 03/03/2021   Sprain and strain of hip and thigh 03/03/2021   Somatic dysfunction of lumbar region 03/03/2021   Skin sensation disturbance 03/03/2021   Screen for colon cancer 03/03/2021   Reduced libido 03/03/2021   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   Pain in joint, pelvic region and thigh 03/03/2021   Nonallopathic lesion of sacral region 03/03/2021   Neuralgia 03/03/2021   Myalgia and myositis 03/03/2021   Microscopic hematuria 03/03/2021   Meralgia paresthetica 03/03/2021   Chronic pain syndrome 03/03/2021   Heartburn 03/03/2021   Hyperlipidemia 03/03/2021   Lower back pain 03/03/2021   Body mass index (BMI) 30.0-30.9, adult 12/11/2019   HSV (herpes simplex virus) anogenital infection 05/31/2019   Allergy to pollen 05/11/2019   Vertigo 09/21/2017   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   Lumbar radiculopathy 03/22/2015   Lumbar degenerative disc disease 03/22/2015   Abnormal mammogram see [facility] mm stable left breast mass since 2013 felt to be benign  08/13/2014   Osteopenia  Dexa done 07/20/2014  [facility] 08/06/2014   DJD (degenerative joint disease) 02/06/2014   HTN (hypertension) 02/06/2014   Tobacco use disorder  quit 20 years ago  02/06/2014   Marijuana use, episodic 02/06/2014   S/P hysterectomy with oophorectomy  has right ovary  02/06/2014   GERD (gastroesophageal reflux disease) 02/06/2014    Current  Outpatient Medications  Medication Sig Dispense Refill   celecoxib (CELEBREX) 100 MG capsule Take 1 capsule (100 mg total) by mouth daily. 30 capsule 0   Cholecalciferol (VITAMIN D3 PO) Take 1 tablet by mouth daily.     ezetimibe (ZETIA) 10 MG tablet Take 1 tablet (10 mg total) by mouth daily. 90 tablet 3   felodipine (PLENDIL) 10 MG 24 hr tablet Take 1 tablet (10 mg total) by mouth daily. 90 tablet 3   fosinopril (MONOPRIL) 20 MG tablet Take 1 tablet (20 mg total) by mouth daily. 90 tablet 3   hydrochlorothiazide (MICROZIDE) 12.5 MG capsule Take 1 capsule (12.5 mg total) by mouth daily. 90 capsule 3   loratadine (CLARITIN) 10 MG tablet Take 10 mg by mouth daily.     amoxicillin (AMOXIL) 500 MG tablet Take 2,000 mg by mouth once. ONLY WHEN GOING TO THE DENTIST (Patient not taking: Reported on 08/21/2022)     pantoprazole (PROTONIX) 40 MG tablet Take 1 tablet (40 mg total) by mouth daily. 90 tablet 3   No current facility-administered medications for this visit.    Allergies: Prednisone and Statins  Past Medical History:  Diagnosis Date   Arthritis    Hypertension     Past Surgical History:  Procedure Laterality Date   ABDOMINAL HYSTERECTOMY  1987   FOOT SURGERY Bilateral 04/20/2000   HERNIA REPAIR     TOTAL HIP ARTHROPLASTY Bilateral 04/20/2006   WISDOM TOOTH EXTRACTION  1986   XI ROBOTIC ASSISTED VENTRAL HERNIA N/A 06/06/2021   Procedure: ROBOTIC INCISIONAL HERNIA REPAIR WITH MESH;  Surgeon: Medical sales representative,  Hyman Hopes, MD;  Location: [facility];  Service: General;  Laterality: N/A;    Family History  Problem Relation Age of Onset   Lung cancer Mother    Hypertension Mother    Hypertension Brother    Bone cancer Maternal Grandmother     Social History   Tobacco Use   Smoking status: Former    Packs/day: 0.50    Years: 20.00    Additional pack years: 0.00    Total pack years: 10.00    Types: Cigarettes    Quit date: 1998    Years since quitting: 26.3   Smokeless tobacco: Never   Substance Use Topics   Alcohol use: Not Currently    Subjective:  Follow up on chronic care needs- patient has been referred to endocrine due to concerning changes on thyroid ultrasound; unfortunately, she has not been able to complete that work up;  Did get established with cardiology to discuss options for cholesterol management- has taken Leqvio;      Objective:  Vitals:   08/21/22 0914  BP: 112/62  Pulse: 65  SpO2: 98%  Weight: 190 lb (86.2 kg)  Height: 5\' 5"  (1.651 m)    General: Well developed, well nourished, in no acute distress  Skin : Warm and dry.  Head: Normocephalic and atraumatic  Eyes: Sclera and conjunctiva clear; pupils round and reactive to light; extraocular movements intact  Ears: External normal; canals clear; tympanic membranes normal  Oropharynx: Pink, supple. No suspicious lesions  Neck: Supple without suspected thyromegaly, no adenopathy  Lungs: Respirations unlabored; clear to auscultation bilaterally without wheeze, rales, rhonchi  CVS exam: normal rate and regular rhythm.  Neurologic: Alert and oriented; speech intact; face symmetrical; moves all extremities well; CNII-XII intact without focal deficit   Assessment:  1. Multiple thyroid nodules   2. Primary hypertension   3. Low vitamin B12 level   4. Elevated glucose   5. History of UTI   6. Abdominal pain, unspecified abdominal location     Plan:  Will repeat thyroid ultrasound; update thyroid panel and PTH labs as previously recommended; to consider endocrine and/or surgical referral;  Check B12 level;  Update urine culture to ensure resolution;  Refill updated for Protonix;   No follow-ups on file.  Orders Placed This Encounter  Procedures   Urine Culture   US THYROID    Standing Status:   Future    Standing Expiration Date:   08/21/2023    Order Specific Question:   Reason for Exam (SYMPTOM  OR DIAGNOSIS REQUIRED)    Answer:   thyroid nodules    Order Specific Question:   Preferred  imaging location?    Answer:   [facility]   Thyroid Panel With TSH   PTH, Intact and Calcium   B12   CBC with Differential/Platelet   Comp Met (CMET)   Hemoglobin A1c    Requested Prescriptions   Signed Prescriptions Disp Refills   pantoprazole (PROTONIX) 40 MG tablet 90 tablet 3    Sig: Take 1 tablet (40 mg total) by mouth daily.

## 2022-08-22 LAB — URINE CULTURE
MICRO NUMBER:: 14910581
SPECIMEN QUALITY:: ADEQUATE

## 2022-08-22 LAB — PTH, INTACT AND CALCIUM
Calcium: 9.4 mg/dL (ref 8.6–10.4)
PTH: 93 pg/mL — ABNORMAL HIGH (ref 16–77)

## 2022-08-22 LAB — THYROID PANEL WITH TSH
Free Thyroxine Index: 2 (ref 1.4–3.8)
T3 Uptake: 33 % (ref 22–35)
T4, Total: 6.2 ug/dL (ref 5.1–11.9)
TSH: 2.25 mIU/L (ref 0.40–4.50)

## 2022-08-24 ENCOUNTER — Other Ambulatory Visit: Payer: Self-pay | Admitting: Family

## 2022-08-24 DIAGNOSIS — R7989 Other specified abnormal findings of blood chemistry: Secondary | ICD-10-CM

## 2022-08-24 DIAGNOSIS — R9389 Abnormal findings on diagnostic imaging of other specified body structures: Secondary | ICD-10-CM

## 2022-08-27 ENCOUNTER — Ambulatory Visit (HOSPITAL_BASED_OUTPATIENT_CLINIC_OR_DEPARTMENT_OTHER)
Admission: RE | Admit: 2022-08-27 | Discharge: 2022-08-27 | Disposition: A | Discharge status: Home / Self Care | Payer: Medicare Other | Source: Ambulatory Visit | Attending: Family | Admitting: Family

## 2022-08-27 DIAGNOSIS — E042 Nontoxic multinodular goiter: Secondary | ICD-10-CM | POA: Diagnosis present

## 2022-08-27 DIAGNOSIS — Z780 Asymptomatic menopausal state: Secondary | ICD-10-CM | POA: Diagnosis present

## 2022-09-01 ENCOUNTER — Other Ambulatory Visit: Payer: Self-pay | Admitting: Family

## 2022-09-01 DIAGNOSIS — E041 Nontoxic single thyroid nodule: Secondary | ICD-10-CM

## 2022-09-24 ENCOUNTER — Other Ambulatory Visit: Payer: Self-pay | Admitting: *Deleted

## 2022-09-24 DIAGNOSIS — E7800 Pure hypercholesterolemia, unspecified: Secondary | ICD-10-CM

## 2022-09-29 ENCOUNTER — Other Ambulatory Visit: Payer: Self-pay | Admitting: Pharmacy Technician

## 2022-09-29 IMAGING — MG MM DIGITAL SCREENING BILAT W/ TOMO AND CAD
8 series · 8 of 24 positions shown · non-contrast
Comparison: Previous exam(s).

CLINICAL DATA: Screening.

EXAM:
DIGITAL SCREENING BILATERAL MAMMOGRAM WITH TOMOSYNTHESIS AND CAD
TECHNIQUE: Bilateral screening digital craniocaudal and mediolateral oblique
mammograms were obtained. Bilateral screening digital breast
tomosynthesis was performed. The images were evaluated with
computer-aided detection.

[R CC synth-2D]
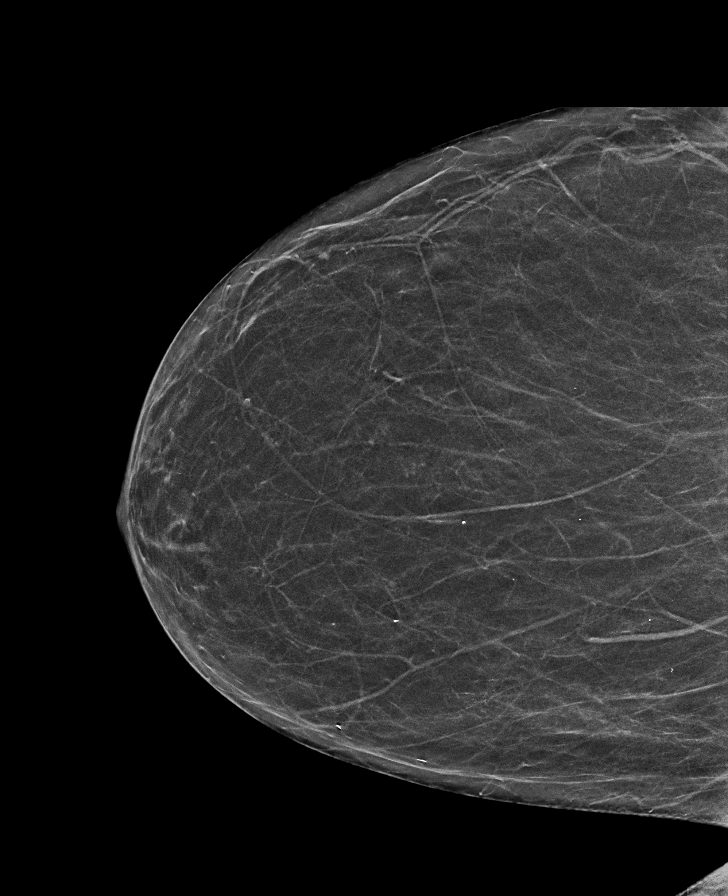

[L MLO synth-2D]
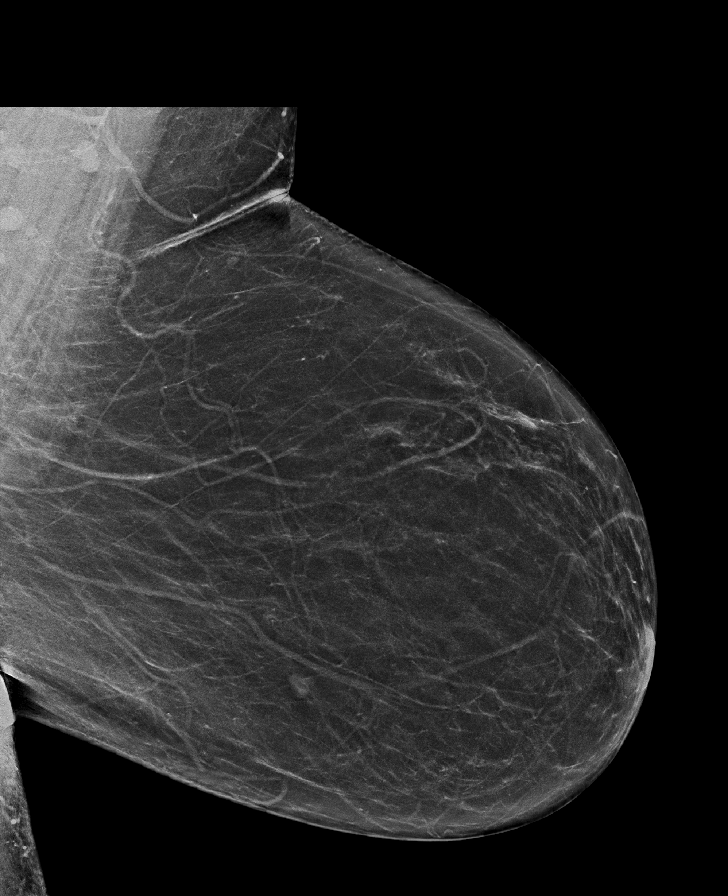

[L CC synth-2D]
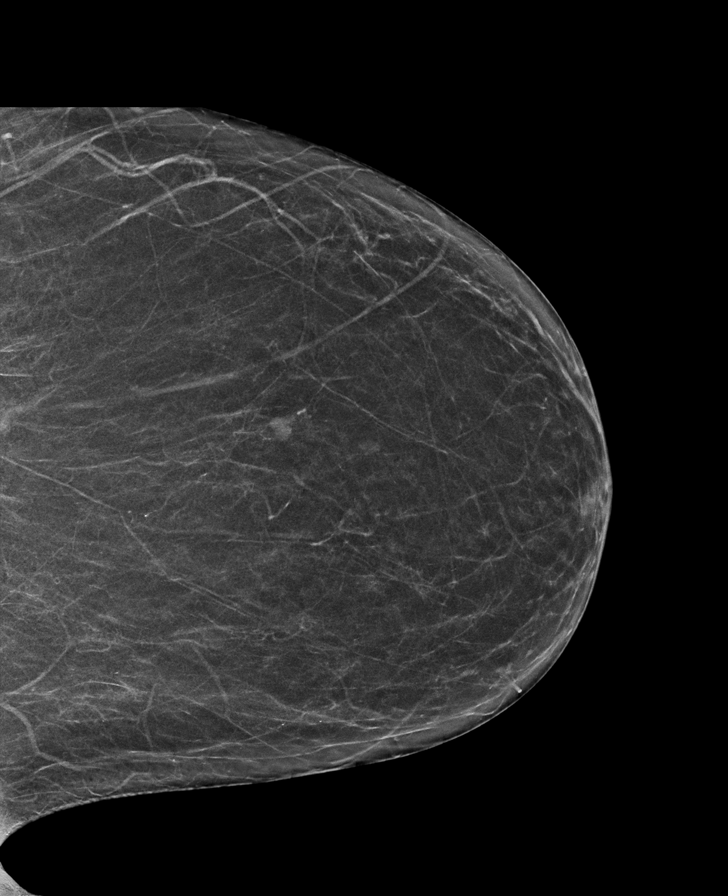

[R MLO synth-2D]
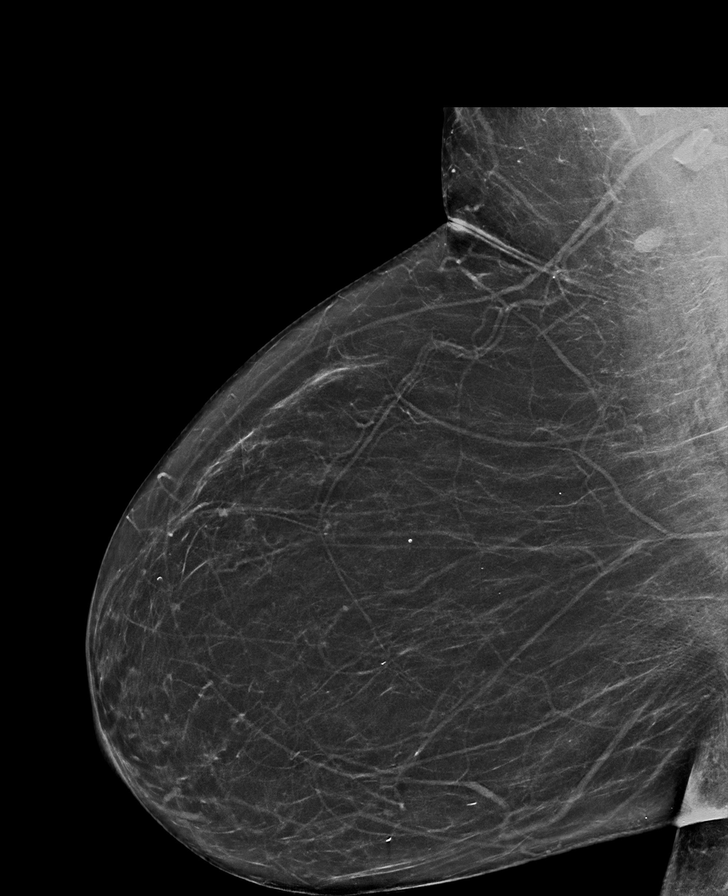

[R MLO tomo · tomo slice 33/66.0]
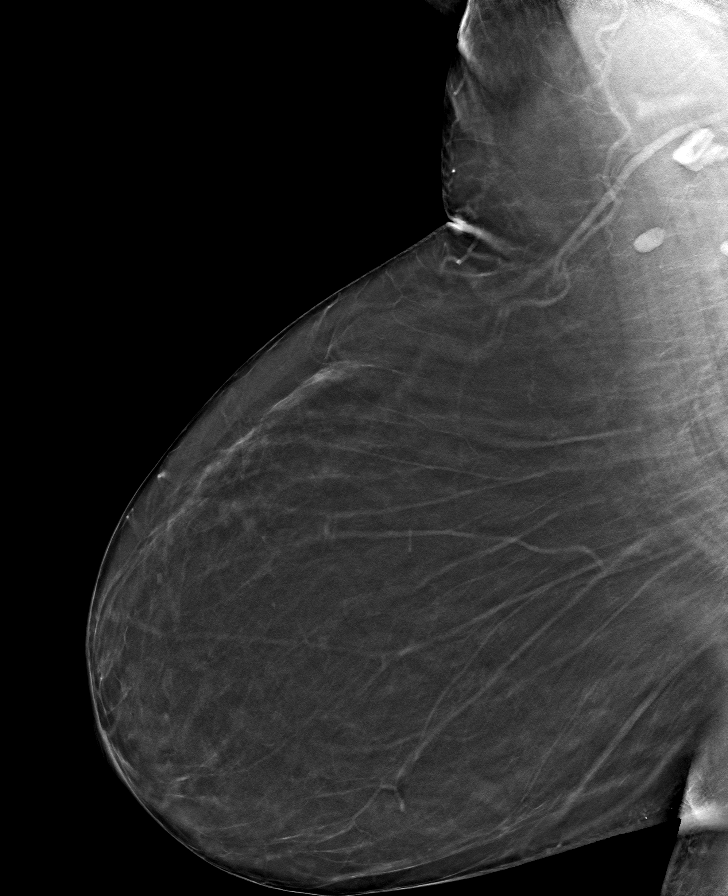

[L MLO tomo · tomo slice 33/64.0]
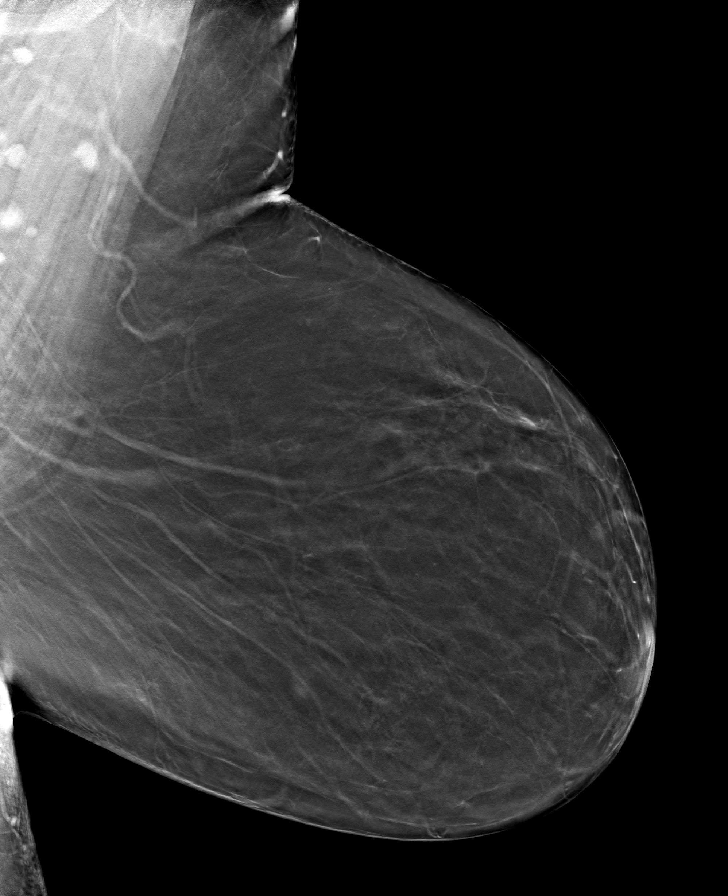

[L CC tomo · tomo slice 27/52.0]
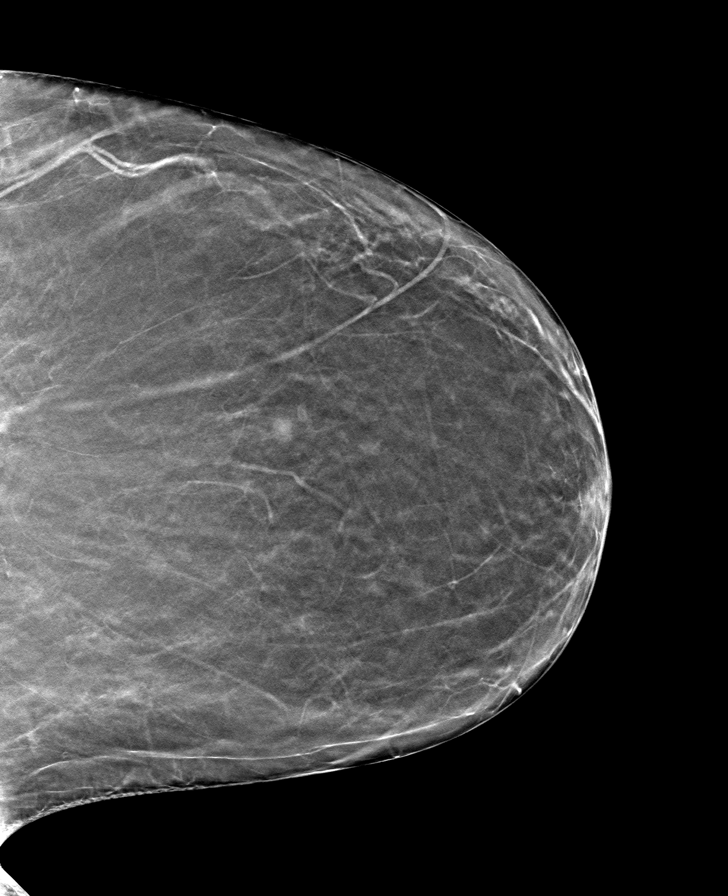

[R CC tomo · tomo slice 27/52.0]
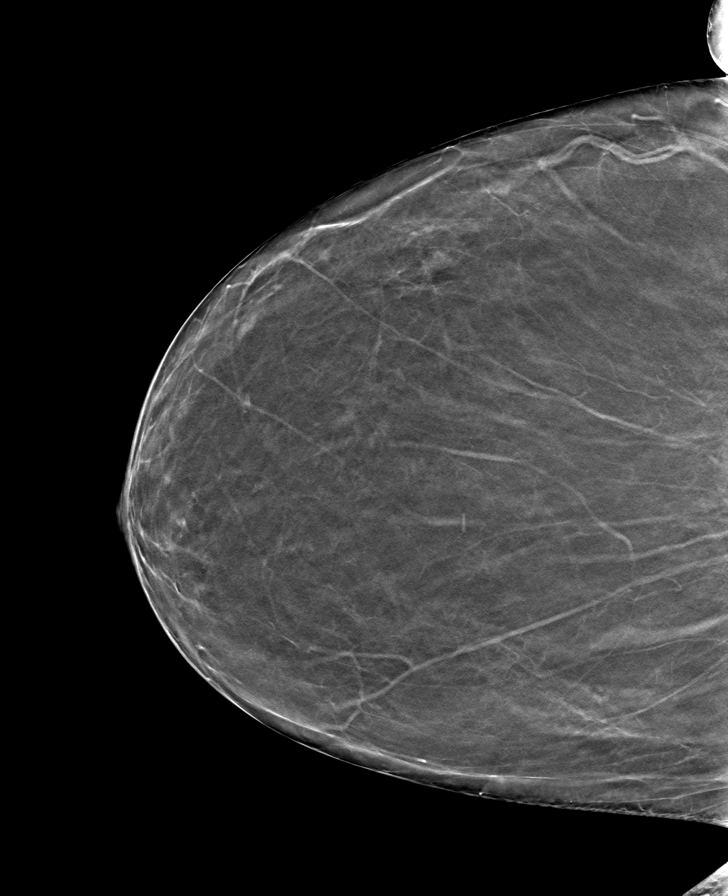

[8 of 24 positions shown; findings below may reference images not displayed]

ACR Breast Density Category b: There are scattered areas of
fibroglandular density.
FINDINGS: There are no findings suspicious for malignancy.
IMPRESSION: No mammographic evidence of malignancy. A result letter of this
screening mammogram will be mailed directly to the patient.

RECOMMENDATION:
Screening mammogram in one year. (Code:51-O-LD2)

BI-RADS CATEGORY  1: Negative.

## 2022-10-05 ENCOUNTER — Ambulatory Visit
Admission: RE | Admit: 2022-10-05 | Discharge: 2022-10-05 | Disposition: A | Discharge status: Home / Self Care | Payer: Medicare Other | Source: Ambulatory Visit | Attending: Family | Admitting: Family

## 2022-10-05 ENCOUNTER — Other Ambulatory Visit (HOSPITAL_COMMUNITY)
Admission: RE | Admit: 2022-10-05 | Discharge: 2022-10-05 | Disposition: A | Discharge status: Home / Self Care | Payer: Medicare Other | Source: Ambulatory Visit | Attending: Family | Admitting: Family

## 2022-10-05 ENCOUNTER — Ambulatory Visit (INDEPENDENT_AMBULATORY_CARE_PROVIDER_SITE_OTHER): Payer: Medicare Other

## 2022-10-05 VITALS — BP 149/92 | HR 67 | Temp 97.3°F | Resp 16 | Ht 65.0 in | Wt 188.4 lb

## 2022-10-05 DIAGNOSIS — E041 Nontoxic single thyroid nodule: Secondary | ICD-10-CM

## 2022-10-05 DIAGNOSIS — E7841 Elevated Lipoprotein(a): Secondary | ICD-10-CM

## 2022-10-05 DIAGNOSIS — E782 Mixed hyperlipidemia: Secondary | ICD-10-CM

## 2022-10-05 MED ORDER — INCLISIRAN SODIUM 284 MG/1.5ML ~~LOC~~ SOSY
284.0000 mg | PREFILLED_SYRINGE | Freq: Once | SUBCUTANEOUS | Status: AC
  Administered 2022-10-05: 284 mg via SUBCUTANEOUS
  Filled 2022-10-05: qty 1.5

## 2022-10-05 NOTE — Progress Notes (Signed)
Diagnosis: Hyperlipidemia  Provider:  Mannam, Praveen MD  Procedure: Injection  Leqvio (inclisiran), Dose: 284 mg, Site: subcutaneous, Number of injections: 1  Post Care:  n/a  Discharge: Condition: Good, Destination: Home . AVS Provided  Performed by:  Davy Faught E Zyheir Daft, LPN       

## 2022-10-06 ENCOUNTER — Ambulatory Visit (HOSPITAL_BASED_OUTPATIENT_CLINIC_OR_DEPARTMENT_OTHER): Payer: Medicare Other | Admitting: Internal Medicine

## 2022-10-07 LAB — CYTOLOGY - NON PAP

## 2022-11-03 ENCOUNTER — Ambulatory Visit (INDEPENDENT_AMBULATORY_CARE_PROVIDER_SITE_OTHER): Payer: Medicare Other | Admitting: Internal Medicine

## 2022-11-03 ENCOUNTER — Encounter (HOSPITAL_BASED_OUTPATIENT_CLINIC_OR_DEPARTMENT_OTHER): Payer: Self-pay | Admitting: Internal Medicine

## 2022-11-03 VITALS — BP 166/84 | HR 67 | Ht 65.0 in | Wt 189.0 lb

## 2022-11-03 DIAGNOSIS — E7841 Elevated Lipoprotein(a): Secondary | ICD-10-CM | POA: Diagnosis not present

## 2022-11-03 DIAGNOSIS — M791 Myalgia, unspecified site: Secondary | ICD-10-CM

## 2022-11-03 DIAGNOSIS — E7800 Pure hypercholesterolemia, unspecified: Secondary | ICD-10-CM

## 2022-11-03 DIAGNOSIS — I700 Atherosclerosis of aorta: Secondary | ICD-10-CM | POA: Diagnosis not present

## 2022-11-03 DIAGNOSIS — T466X5A Adverse effect of antihyperlipidemic and antiarteriosclerotic drugs, initial encounter: Secondary | ICD-10-CM

## 2022-11-03 NOTE — Patient Instructions (Signed)
Medication Instructions:  NO CHANGES  *If you need a refill on your cardiac medications before your next appointment, please call your pharmacy*   Lab Work: NMR Lipoprofile, LPa today    FASTING labs in 1 year -- before next visit  If you have labs (blood work) drawn today and your tests are completely normal, you will receive your results only by: MyChart Message (if you have MyChart) OR A paper copy in the mail If you have any lab test that is abnormal or we need to change your treatment, we will call you to review the results.   Follow-Up: At [facility], you and your health needs are our priority.  As part of our continuing mission to provide you with exceptional heart care, we have created designated Provider Care Teams.  These Care Teams include your primary Cardiologist (physician) and Advanced Practice Providers (APPs -  Physician Assistants and Nurse Practitioners) who all work together to provide you with the care you need, when you need it.  We recommend signing up for the patient portal called "MyChart".  Sign up information is provided on this After Visit Summary.  MyChart is used to connect with patients for Virtual Visits (Telemedicine).  Patients are able to view lab/test results, encounter notes, upcoming appointments, etc.  Non-urgent messages can be sent to your provider as well.   To learn more about what you can do with MyChart, go to [email or web address].    Your next appointment:    12 months with Dr. Rennis Golden

## 2022-11-03 NOTE — Progress Notes (Signed)
LIPID CLINIC CONSULT NOTE  Chief Complaint:  Manage dyslipidemia  Primary Care Physician: Olive Bass, FNP  Primary Cardiologist:  None  HPI:  Kathryn Pruitt is a 77 y.o. female who is being seen today for the evaluation of dyslipidemia at the request of Olive Bass,*. This is a pleasant 77 year old female kindly referred for evaluation management of dyslipidemia.  She has a history of high cholesterol which has been rising.  Her LDL about 8 years ago was 657 then 169 and now up to 217.  She reports not being very active.  In the past she was on rosuvastatin which was prescribed by [facility] medical.  She had side effects including myalgias with that.  Subsequently she was recommended to start ezetimibe.  She is likely been on that for about 6 months since her last lab work.  She has not had repeat labs since then.  She does get some muscle aches but is not clear that it is much worse on the Zetia than her baseline.  She does report that her brother had died of heart attack, although it is not clear that there is high cholesterol heart disease and other family members but both of her parents are deceased.  She was noted on abdominal imaging in January of this year to have both hepatic steatosis and aortic atherosclerosis.  11/03/2022  Ms. Pepperman returns today for follow-up.  She has been on Leqvio therapy.  She has had 2 injections over the past several months which she seems to be tolerating well.  She has not had repeat lipids since she has been on therapy.  Her next injection is scheduled for December.  Previously her LP(a) was elevated 128.5 nmol/L.  Her last LDL was 150 with a particle #[id number].  PMHx:  Past Medical History:  Diagnosis Date   Arthritis    Hypertension     Past Surgical History:  Procedure Laterality Date   ABDOMINAL HYSTERECTOMY  1987   FOOT SURGERY Bilateral 04/20/2000   HERNIA REPAIR     TOTAL HIP ARTHROPLASTY Bilateral 04/20/2006   WISDOM  TOOTH EXTRACTION  1986   XI ROBOTIC ASSISTED VENTRAL HERNIA N/A 06/06/2021   Procedure: ROBOTIC INCISIONAL HERNIA REPAIR WITH MESH;  Surgeon: Stechschulte, Hyman Hopes, MD;  Location: [facility];  Service: General;  Laterality: N/A;    FAMHx:  Family History  Problem Relation Age of Onset   Lung cancer Mother    Hypertension Mother    Hypertension Brother    Bone cancer Maternal Grandmother     SOCHx:   reports that she quit smoking about 26 years ago. Her smoking use included cigarettes. She started smoking about 46 years ago. She has a 10 pack-year smoking history. She has never used smokeless tobacco. She reports that she does not currently use alcohol. She reports that she does not currently use drugs after having used the following drugs: Marijuana.  ALLERGIES:  Allergies  Allergen Reactions   Prednisone Hives   Statins Other (See Comments)    Myalgia    ROS: Pertinent items noted in HPI and remainder of comprehensive ROS otherwise negative.  HOME MEDS: Current Outpatient Medications on File Prior to Visit  Medication Sig Dispense Refill   amoxicillin (AMOXIL) 500 MG tablet Take 2,000 mg by mouth once. ONLY WHEN GOING TO THE DENTIST     celecoxib (CELEBREX) 100 MG capsule Take 1 capsule (100 mg total) by mouth daily. 30 capsule 0   Cholecalciferol (VITAMIN D3 PO)  Take 1 tablet by mouth daily.     ezetimibe (ZETIA) 10 MG tablet Take 1 tablet (10 mg total) by mouth daily. 90 tablet 3   felodipine (PLENDIL) 10 MG 24 hr tablet Take 1 tablet (10 mg total) by mouth daily. 90 tablet 3   fosinopril (MONOPRIL) 20 MG tablet Take 1 tablet (20 mg total) by mouth daily. 90 tablet 3   hydrochlorothiazide (MICROZIDE) 12.5 MG capsule Take 1 capsule (12.5 mg total) by mouth daily. 90 capsule 3   loratadine (CLARITIN) 10 MG tablet Take 10 mg by mouth daily.     pantoprazole (PROTONIX) 40 MG tablet Take 1 tablet (40 mg total) by mouth daily. 90 tablet 3   [DISCONTINUED] metoCLOPramide (REGLAN) 10 MG  tablet Take 10 mg by mouth 2 (two) times daily.     No current facility-administered medications on file prior to visit.    LABS/IMAGING: No results found for this or any previous visit (from the past 48 hour(s)). No results found.  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   Ms. Umbaugh is now on Leqvio which she seems to be tolerating well.  She has not had recent repeat lipid testing.  Will order a repeat LP(a) and NMR and plan to continue this therapy with follow-up with me in 1 year or sooner as necessary.  Chrystie Nose, MD, [facility], FACP  [place]  [facility]  Medical Director of the [facility]  [facility]ovascular [facility] Reduction Clinic Diplomate of the American Board of Clinical Lipidology Attending Cardiologist  Direct Dial: [phone]  Fax: [phone]  Website:  [email or web address] 11/03/2022, 11:01 AM

## 2022-11-08 LAB — NMR, LIPOPROFILE
Cholesterol, Total: 172 mg/dL (ref 100–199)
HDL Particle Number: 49.1 umol/L (ref >=30.5)
HDL-C: 71 mg/dL (ref >39)
LDL Particle Number: 1067 nmol/L — ABNORMAL HIGH (ref <1000)
LDL Size: 20.8 nm (ref >20.5)
LDL-C (NIH Calc): 81 mg/dL (ref 0–99)
LP-IR Score: 50 — ABNORMAL HIGH (ref <=45)
Small LDL Particle Number: 503 nmol/L (ref <=527)
Triglycerides: 111 mg/dL (ref 0–149)

## 2022-11-08 LAB — LIPOPROTEIN A (LPA): Lipoprotein (a): 93.3 nmol/L — ABNORMAL HIGH (ref <75.0)

## 2022-11-18 ENCOUNTER — Encounter: Payer: Self-pay | Admitting: Internal Medicine

## 2022-11-18 ENCOUNTER — Other Ambulatory Visit: Payer: Self-pay | Admitting: Family

## 2022-11-18 MED ORDER — CELECOXIB 100 MG PO CAPS: 100.0000 mg | ORAL_CAPSULE | Freq: Every day | ORAL | 0 refills | Status: DC

## 2022-11-30 ENCOUNTER — Other Ambulatory Visit: Payer: Self-pay | Admitting: Family

## 2022-11-30 DIAGNOSIS — E785 Hyperlipidemia, unspecified: Secondary | ICD-10-CM

## 2022-12-24 ENCOUNTER — Other Ambulatory Visit: Payer: Self-pay | Admitting: Family

## 2022-12-25 MED ORDER — CELECOXIB 100 MG PO CAPS: 100.0000 mg | ORAL_CAPSULE | Freq: Every day | ORAL | 0 refills | Status: DC

## 2023-02-01 ENCOUNTER — Other Ambulatory Visit: Payer: Self-pay | Admitting: Family

## 2023-02-01 MED ORDER — CELECOXIB 100 MG PO CAPS: 100.0000 mg | ORAL_CAPSULE | Freq: Every day | ORAL | 0 refills | Status: DC

## 2023-02-01 MED ORDER — AMOXICILLIN 500 MG PO TABS: 2000.0000 mg | ORAL_TABLET | Freq: Once | ORAL | 0 refills | Status: AC

## 2023-03-02 ENCOUNTER — Other Ambulatory Visit: Payer: Self-pay | Admitting: Family

## 2023-03-03 ENCOUNTER — Other Ambulatory Visit: Payer: Self-pay | Admitting: Family

## 2023-03-03 MED ORDER — CELECOXIB 100 MG PO CAPS: 100.0000 mg | ORAL_CAPSULE | Freq: Every day | ORAL | 0 refills | Status: DC

## 2023-04-05 ENCOUNTER — Ambulatory Visit (INDEPENDENT_AMBULATORY_CARE_PROVIDER_SITE_OTHER): Payer: Medicare Other

## 2023-04-05 VITALS — BP 169/90 | HR 69 | Temp 97.9°F | Resp 20 | Ht 65.0 in | Wt 182.0 lb

## 2023-04-05 DIAGNOSIS — E7841 Elevated Lipoprotein(a): Secondary | ICD-10-CM | POA: Diagnosis not present

## 2023-04-05 DIAGNOSIS — E782 Mixed hyperlipidemia: Secondary | ICD-10-CM

## 2023-04-05 MED ORDER — INCLISIRAN SODIUM 284 MG/1.5ML ~~LOC~~ SOSY
284.0000 mg | PREFILLED_SYRINGE | Freq: Once | SUBCUTANEOUS | Status: AC
  Administered 2023-04-05: 284 mg via SUBCUTANEOUS
  Filled 2023-04-05: qty 1.5

## 2023-04-05 NOTE — Progress Notes (Signed)
Diagnosis: Hyperlipidemia  Provider:  Chilton Greathouse MD  Procedure: Injection  Leqvio (inclisiran), Dose: 284 mg, Site: subcutaneous, Number of injections: 1  Injection Site(s): Left lower quad. abdomen   Discharge: Condition: Good, Destination: Home . AVS Provided  Performed by:  Nat Math, RN

## 2023-04-07 ENCOUNTER — Ambulatory Visit: Payer: Medicare Other

## 2023-04-12 ENCOUNTER — Other Ambulatory Visit: Payer: Self-pay | Admitting: Family

## 2023-04-12 MED ORDER — CELECOXIB 100 MG PO CAPS: 100.0000 mg | ORAL_CAPSULE | Freq: Every day | ORAL | 0 refills | Status: DC

## 2023-05-17 ENCOUNTER — Other Ambulatory Visit: Payer: Self-pay | Admitting: Family

## 2023-05-17 MED ORDER — CELECOXIB 100 MG PO CAPS: 100.0000 mg | ORAL_CAPSULE | Freq: Every day | ORAL | 0 refills | Status: DC

## 2023-05-31 ENCOUNTER — Other Ambulatory Visit: Payer: Self-pay | Admitting: Family

## 2023-06-04 ENCOUNTER — Telehealth: Payer: Self-pay | Admitting: Pharmacy Technician

## 2023-06-04 NOTE — Telephone Encounter (Signed)
Auth Submission: NO AUTH NEEDED Site of care: Site of care: [facility] Payer: MEDICARE A/B & TRICARE Medication & CPT/J Code(s) submitted: Leqvio (Inclisiran) [id number] Route of submission (phone, fax, portal):  Phone # Fax # Auth type: Buy/Bill HB Units/visits requested: 2 Reference number:  Approval from: 06/04/23 to 05/21/23

## 2023-06-17 ENCOUNTER — Other Ambulatory Visit: Payer: Self-pay | Admitting: Family

## 2023-06-18 MED ORDER — CELECOXIB 100 MG PO CAPS: 100.0000 mg | ORAL_CAPSULE | Freq: Every day | ORAL | 1 refills | Status: DC

## 2023-06-25 ENCOUNTER — Ambulatory Visit: Admitting: Family

## 2023-06-25 ENCOUNTER — Other Ambulatory Visit (HOSPITAL_BASED_OUTPATIENT_CLINIC_OR_DEPARTMENT_OTHER): Payer: Self-pay

## 2023-06-25 ENCOUNTER — Encounter: Payer: Self-pay | Admitting: Family

## 2023-06-25 ENCOUNTER — Encounter: Payer: Self-pay | Admitting: Internal Medicine

## 2023-06-25 VITALS — BP 142/94 | HR 64 | Ht 65.0 in | Wt 180.4 lb

## 2023-06-25 DIAGNOSIS — Z1231 Encounter for screening mammogram for malignant neoplasm of breast: Secondary | ICD-10-CM

## 2023-06-25 DIAGNOSIS — G894 Chronic pain syndrome: Secondary | ICD-10-CM

## 2023-06-25 MED ORDER — ONDANSETRON HCL 4 MG PO TABS
4.0000 mg | ORAL_TABLET | Freq: Three times a day (TID) | ORAL | 0 refills | Status: AC | PRN
  Filled 2023-06-25: qty 20, 7d supply, fill #0

## 2023-06-25 MED ORDER — GABAPENTIN 100 MG PO CAPS
200.0000 mg | ORAL_CAPSULE | Freq: Every evening | ORAL | 0 refills | Status: DC | PRN
  Filled 2023-06-25: qty 60, 30d supply, fill #0

## 2023-06-25 NOTE — Progress Notes (Signed)
 Kathryn Pruitt is a 78 y.o. 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   Osteoarthrosis, pelvic region and thigh 08/06/2021   Incisional hernia 06/06/2021   Acquired absence of genital organ 03/03/2021   Acute cholecystitis 03/03/2021   Allergic rhinitis 03/03/2021   Cellulitis and abscess of leg 03/03/2021   Symptomatic menopausal or female climacteric states 03/03/2021   Sprain and strain of hip and thigh 03/03/2021   Somatic dysfunction of lumbar region 03/03/2021   Skin sensation disturbance 03/03/2021   Screen for colon cancer 03/03/2021   Reduced libido 03/03/2021   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   Pain in joint, pelvic region and thigh 03/03/2021   Nonallopathic lesion of sacral region 03/03/2021   Neuralgia 03/03/2021   Myalgia and myositis 03/03/2021   Microscopic hematuria 03/03/2021   Meralgia paresthetica 03/03/2021   Chronic pain syndrome 03/03/2021   Heartburn 03/03/2021   Hyperlipidemia 03/03/2021   Lower back pain 03/03/2021   Body mass index (BMI) 30.0-30.9, adult 12/11/2019   HSV (herpes simplex virus) anogenital infection 05/31/2019   Allergy to pollen 05/11/2019   Vertigo 09/21/2017   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   Lumbar radiculopathy 03/22/2015   Lumbar degenerative disc disease 03/22/2015   Abnormal mammogram see [facility] mm stable left breast mass since 2013 felt to be benign  08/13/2014   Osteopenia  Dexa done 07/20/2014  [facility] 08/06/2014   DJD (degenerative joint disease) 02/06/2014   HTN (hypertension) 02/06/2014   Tobacco use disorder  quit 20 years ago  02/06/2014   Marijuana use, episodic 02/06/2014   S/P hysterectomy with oophorectomy  has right ovary  02/06/2014   GERD (gastroesophageal reflux disease) 02/06/2014    Current  Outpatient Medications  Medication Sig Dispense Refill   celecoxib (CELEBREX) 100 MG capsule Take 1 capsule (100 mg total) by mouth daily. 30 capsule 1   Cholecalciferol (VITAMIN D3 PO) Take 1 tablet by mouth daily.     ezetimibe (ZETIA) 10 MG tablet TAKE 1 TABLET(10 MG) BY MOUTH DAILY 90 tablet 3   felodipine (PLENDIL) 10 MG 24 hr tablet TAKE 1 TABLET(10 MG) BY MOUTH DAILY 90 tablet 3   fosinopril (MONOPRIL) 20 MG tablet TAKE 1 TABLET(20 MG) BY MOUTH DAILY 90 tablet 3   gabapentin (NEURONTIN) 100 MG capsule Take 2 capsules (200 mg total) by mouth at bedtime as needed. 60 capsule 0   hydrochlorothiazide (MICROZIDE) 12.5 MG capsule TAKE 1 CAPSULE(12.5 MG) BY MOUTH DAILY 90 capsule 3   inclisiran (LEQVIO) 284 MG/1.5ML SOSY injection Inject 284 mg into the skin once.     loratadine (CLARITIN) 10 MG tablet Take 10 mg by mouth daily.     ondansetron (ZOFRAN) 4 MG tablet Take 1 tablet (4 mg total) by mouth every 8 (eight) hours as needed for nausea or vomiting. 20 tablet 0   pantoprazole (PROTONIX) 40 MG tablet TAKE 1 TABLET(40 MG) BY MOUTH DAILY 90 tablet 3   No current facility-administered medications for this visit.    Allergies: Prednisone and Statins  Past Medical History:  Diagnosis Date   Arthritis    Hypertension     Past Surgical History:  Procedure Laterality Date   ABDOMINAL HYSTERECTOMY  1987   FOOT SURGERY Bilateral 04/20/2000   HERNIA REPAIR     TOTAL HIP ARTHROPLASTY  Bilateral 04/20/2006   WISDOM TOOTH EXTRACTION  1986   XI ROBOTIC ASSISTED VENTRAL HERNIA N/A 06/06/2021   Procedure: ROBOTIC INCISIONAL HERNIA REPAIR WITH MESH;  Surgeon: Stechschulte, Hyman Hopes, MD;  Location: [facility];  Service: General;  Laterality: N/A;    Family History  Problem Relation Age of Onset   Lung cancer Mother    Hypertension Mother    Hypertension Brother    Bone cancer Maternal Grandmother     Social History   Tobacco Use   Smoking status: Former    Current packs/day: 0.00    Average  packs/day: 0.5 packs/day for 20.0 years (10.0 ttl pk-yrs)    Types: Cigarettes    Start date: 36    Quit date: 1998    Years since quitting: 27.1   Smokeless tobacco: Never  Substance Use Topics   Alcohol use: Not Currently    Subjective:   History of chronic pain syndrome- asking for her own prescription for Gabapentin; has used in the past with some relief; would like to be able to take Gabapentin "when the weather" makes her hurt;  Is currently taking Celebrex daily ( 100 mg);  Also has history of intermittent nausea- has taken Zofran in the past and would like to get updated prescription;     Objective:  Vitals:   06/25/23 1353  BP: (!) 142/94  Pulse: 64  SpO2: 99%  Weight: 180 lb 6.4 oz (81.8 kg)  Height: 5\' 5"  (1.651 m)    General: Well developed, well nourished, in no acute distress  Skin : Warm and dry.  Head: Normocephalic and atraumatic  Eyes: Sclera and conjunctiva clear; pupils round and reactive to light; extraocular movements intact  Ears: External normal; canals clear; tympanic membranes normal  Oropharynx: Pink, supple. No suspicious lesions  Neck: Supple without thyromegaly, adenopathy  Lungs: Respirations unlabored; clear to auscultation bilaterally without wheeze, rales, rhonchi  CVS exam: normal rate and regular rhythm.  Neurologic: Alert and oriented; speech intact; face symmetrical; moves all extremities well; CNII-XII intact without focal deficit   Assessment:  1. Chronic pain syndrome   2. Visit for screening mammogram     Plan:  Patient has used Gabapentin in the past for similar symptoms- she is requesting low dosage to use only as needed; she is also requesting updated Rx for Zofran to use as needed;  Order for screening mammogram updated;  Plan to follow up in 3 months to re-check blood pressure/ labs;   Return in about 3 months (around 09/25/2023) for follow up.  Orders Placed This Encounter  Procedures   MM 3D SCREENING MAMMOGRAM  BILATERAL BREAST    Standing Status:   Future    Expiration Date:   06/24/2024    Reason for Exam (SYMPTOM  OR DIAGNOSIS REQUIRED):   screening mammogram    Preferred imaging location?:   [facility]    Requested Prescriptions   Signed Prescriptions Disp Refills   gabapentin (NEURONTIN) 100 MG capsule 60 capsule 0    Sig: Take 2 capsules (200 mg total) by mouth at bedtime as needed.   ondansetron (ZOFRAN) 4 MG tablet 20 tablet 0    Sig: Take 1 tablet (4 mg total) by mouth every 8 (eight) hours as needed for nausea or vomiting.

## 2023-07-01 ENCOUNTER — Ambulatory Visit (HOSPITAL_BASED_OUTPATIENT_CLINIC_OR_DEPARTMENT_OTHER)
Admission: RE | Admit: 2023-07-01 | Discharge: 2023-07-01 | Disposition: A | Discharge status: Home / Self Care | Source: Ambulatory Visit | Attending: Family | Admitting: Family

## 2023-07-01 ENCOUNTER — Encounter (HOSPITAL_BASED_OUTPATIENT_CLINIC_OR_DEPARTMENT_OTHER): Payer: Self-pay

## 2023-07-01 DIAGNOSIS — Z1231 Encounter for screening mammogram for malignant neoplasm of breast: Secondary | ICD-10-CM | POA: Insufficient documentation

## 2023-08-26 ENCOUNTER — Ambulatory Visit

## 2023-08-26 DIAGNOSIS — Z0000 Encounter for general adult medical examination without abnormal findings: Secondary | ICD-10-CM | POA: Diagnosis not present

## 2023-08-26 NOTE — Progress Notes (Signed)
 Subjective:   Kathryn Pruitt is a 78 y.o. who presents for a Medicare Wellness preventive visit.  As a reminder, Annual Wellness Visits don't include a physical exam, and some assessments may be limited, especially if this visit is performed virtually. We may recommend an in-person visit if needed.  Visit Complete: Virtual I connected with  [name] on 08/26/23 by a audio enabled telemedicine application and verified that I am speaking with the correct person using two identifiers.  Patient Location: Home  Provider Location: Home Office  I discussed the limitations of evaluation and management by telemedicine. The patient expressed understanding and agreed to proceed.  Vital Signs: Because this visit was a virtual/telehealth visit, some criteria may be missing or patient reported. Any vitals not documented were not able to be obtained and vitals that have been documented are patient reported.  VideoError- Librarian, academic were attempted between this provider and patient, however failed, due to patient having technical difficulties OR patient did not have access to video capability.  We continued and completed visit with audio only.   Persons Participating in Visit: Patient.  AWV Questionnaire: No: Patient Medicare AWV questionnaire was not completed prior to this visit.  Cardiac Risk Factors include: advanced age (>38men, >64 women);dyslipidemia;hypertension     Objective:     Today's Vitals   There is no height or weight on file to calculate BMI.     08/26/2023    3:56 PM 08/17/2022    9:42 AM 04/24/2022    3:12 PM 08/07/2021   10:30 AM 06/06/2021    1:27 PM 05/28/2021    9:45 AM  Advanced Directives  Does Patient Have a Medical Advance Directive? No No No No No No  Would patient like information on creating a medical advance directive?  No - Patient declined  No - Patient declined No - Patient declined Yes (MAU/Ambulatory/Procedural Areas -  Information given)    Current Medications (verified) Outpatient Encounter Medications as of 08/26/2023  Medication Sig   celecoxib  (CELEBREX ) 100 MG capsule Take 1 capsule (100 mg total) by mouth daily.   Cholecalciferol (VITAMIN D3 PO) Take 1 tablet by mouth daily.   ezetimibe  (ZETIA ) 10 MG tablet TAKE 1 TABLET(10 MG) BY MOUTH DAILY   felodipine  (PLENDIL ) 10 MG 24 hr tablet TAKE 1 TABLET(10 MG) BY MOUTH DAILY   fosinopril  (MONOPRIL ) 20 MG tablet TAKE 1 TABLET(20 MG) BY MOUTH DAILY   gabapentin  (NEURONTIN ) 100 MG capsule Take 2 capsules (200 mg total) by mouth at bedtime as needed.   hydrochlorothiazide  (MICROZIDE ) 12.5 MG capsule TAKE 1 CAPSULE(12.5 MG) BY MOUTH DAILY   inclisiran (LEQVIO ) 284 MG/1.5ML SOSY injection Inject 284 mg into the skin once.   loratadine  (CLARITIN ) 10 MG tablet Take 10 mg by mouth daily.   ondansetron  (ZOFRAN ) 4 MG tablet Take 1 tablet (4 mg total) by mouth every 8 (eight) hours as needed for nausea or vomiting.   pantoprazole  (PROTONIX ) 40 MG tablet TAKE 1 TABLET(40 MG) BY MOUTH DAILY   [DISCONTINUED] metoCLOPramide (REGLAN) 10 MG tablet Take 10 mg by mouth 2 (two) times daily.   No facility-administered encounter medications on file as of 08/26/2023.    Allergies (verified) Prednisone and Statins   History: Past Medical History:  Diagnosis Date   Arthritis    Hypertension    Past Surgical History:  Procedure Laterality Date   ABDOMINAL HYSTERECTOMY  1987   FOOT SURGERY Bilateral 04/20/2000   HERNIA REPAIR  TOTAL HIP ARTHROPLASTY Bilateral 04/20/2006   WISDOM TOOTH EXTRACTION  1986   XI ROBOTIC ASSISTED VENTRAL HERNIA N/A 06/06/2021   Procedure: ROBOTIC INCISIONAL HERNIA REPAIR WITH MESH;  Surgeon: Stechschulte, Avon Boers, MD;  Location: [facility];  Service: General;  Laterality: N/A;   Family History  Problem Relation Age of Onset   Lung cancer Mother    Hypertension Mother    Hypertension Brother    Bone cancer Maternal Grandmother    Social  History   Socioeconomic History   Marital status: Married    Spouse name: Not on file   Number of children: Not on file   Years of education: Not on file   Highest education level: 12th grade  Occupational History   Not on file  Tobacco Use   Smoking status: Former    Current packs/day: 0.00    Average packs/day: 0.5 packs/day for 20.0 years (10.0 ttl pk-yrs)    Types: Cigarettes    Start date: 62    Quit date: 44    Years since quitting: 27.3   Smokeless tobacco: Never  Vaping Use   Vaping status: Never Used  Substance and Sexual Activity   Alcohol use: Not Currently   Drug use: Not Currently    Types: Marijuana   Sexual activity: Not on file  Other Topics Concern   Not on file  Social History Narrative   Not on file   Social Drivers of Health   Financial Resource Strain: Low Risk  (08/26/2023)   Overall Financial Resource Strain (CARDIA)    Difficulty of Paying Living Expenses: Not hard at all  Food Insecurity: No Food Insecurity (08/26/2023)   Hunger Vital Sign    Worried About Running Out of Food in the Last Year: Never true    Ran Out of Food in the Last Year: Never true  Transportation Needs: No Transportation Needs (08/26/2023)   PRAPARE - Administrator, Civil Service (Medical): No    Lack of Transportation (Non-Medical): No  Physical Activity: Insufficiently Active (08/26/2023)   Exercise Vital Sign    Days of Exercise per Week: 2 days    Minutes of Exercise per Session: 30 min  Stress: No Stress Concern Present (08/26/2023)   [employer] of Occupational Health - Occupational Stress Questionnaire    Feeling of Stress : Not at all  Social Connections: Moderately Isolated (08/26/2023)   Social Connection and Isolation Panel [NHANES]    Frequency of Communication with Friends and Family: More than three times a week    Frequency of Social Gatherings with Friends and Family: Once a week    Attends Religious Services: Never    Doctor, general practice or Organizations: No    Attends Engineer, structural: Never    Marital Status: Married    Tobacco Counseling Counseling given: Not Answered    Clinical Intake:  Pre-visit preparation completed: Yes  Pain : No/denies pain     Nutritional Risks: Nausea/ vomitting/ diarrhea (nausea at times) Diabetes: No  Lab Results  Component Value Date   HGBA1C 6.1 08/21/2022     How often do you need to have someone help you when you read instructions, pamphlets, or other written materials from your doctor or pharmacy?: 1 - Never  Interpreter Needed?: No  Information entered by :: NAllen LPN   Activities of Daily Living     08/26/2023    3:48 PM  In your present state of health, do you have  any difficulty performing the following activities:  Hearing? 0  Vision? 0  Difficulty concentrating or making decisions? 0  Walking or climbing stairs? 1  Dressing or bathing? 0  Doing errands, shopping? 0  Preparing Food and eating ? N  Using the Toilet? N  In the past six months, have you accidently leaked urine? N  Do you have problems with loss of bowel control? Y  Comment has happened once or twice  Managing your Medications? N  Managing your Finances? N  Housekeeping or managing your Housekeeping? N    Patient Care Team: Adra Alanis, FNP as PCP - General (Internal Medicine)  Indicate any recent Medical Services you may have received from other than [facility] providers in the past year (date may be approximate).     Assessment:    This is a routine wellness examination for Tayana.  Hearing/Vision screen Hearing Screening - Comments:: Denies hearing issues Vision Screening - Comments:: Regular eye exams [facility]   Goals Addressed             This Visit's Progress    Patient Stated       08/26/2023, wants to walk more       Depression Screen     08/26/2023    3:58 PM 08/17/2022    9:45 AM 12/02/2021   10:43 AM 08/07/2021   10:22 AM 01/21/2021     9:39 AM 12/05/2020   10:02 AM 07/16/2014   11:37 AM  PHQ 2/9 Scores  PHQ - 2 Score 0 0 0 1 0 0 0  PHQ- 9 Score 0          Fall Risk     08/26/2023    3:57 PM 08/17/2022    9:44 AM 12/02/2021   10:43 AM 08/07/2021   10:30 AM 01/21/2021    9:39 AM  Fall Risk   Falls in the past year? 0 0 0 0 0  Number falls in past yr: 0 0 0 0 0  Injury with Fall? 0 0 0 0 0  Risk for fall due to : Medication side effect No Fall Risks No Fall Risks No Fall Risks No Fall Risks  Follow up Falls prevention discussed;Falls evaluation completed Falls evaluation completed Falls evaluation completed Falls prevention discussed Falls evaluation completed    MEDICARE RISK AT HOME:  Medicare Risk at Home Any stairs in or around the home?: Yes If so, are there any without handrails?: No Home free of loose throw rugs in walkways, pet beds, electrical cords, etc?: Yes Adequate lighting in your home to reduce risk of falls?: Yes Life alert?: No Use of a cane, Jorgenson or w/c?: No Grab bars in the bathroom?: No Shower chair or bench in shower?: No Elevated toilet seat or a handicapped toilet?: Yes  TIMED UP AND GO:  Was the test performed?  No  Cognitive Function: 6CIT completed        08/26/2023    3:59 PM 08/17/2022    9:54 AM 08/07/2021   10:33 AM  6CIT Screen  What Year? 0 points 0 points 0 points  What month? 0 points 0 points 0 points  What time? 3 points 0 points 0 points  Count back from 20 0 points 0 points 0 points  Months in reverse 0 points 0 points 0 points  Repeat phrase 0 points 0 points 0 points  Total Score 3 points 0 points 0 points    Immunizations Immunization History  Administered Date(s) Administered  Fluad Quad(high Dose 65+) 01/21/2021   Influenza Inj Mdck Quad With Preservative 01/19/2023    Screening Tests Health Maintenance  Topic Date Due   COVID-19 Vaccine (1) Never done   Hepatitis C Screening  Never done   DTaP/Tdap/Td (1 - Tdap) Never done   Zoster Vaccines-  Shingrix (1 of 2) Never done   Pneumonia Vaccine 62+ Years old (1 of 1 - PCV) Never done   INFLUENZA VACCINE  11/19/2023   Medicare Annual Wellness (AWV)  08/25/2024   Colonoscopy  04/20/2030   DEXA SCAN  Completed   HPV VACCINES  Aged Out   Meningococcal B Vaccine  Aged Out    Health Maintenance  Health Maintenance Due  Topic Date Due   COVID-19 Vaccine (1) Never done   Hepatitis C Screening  Never done   DTaP/Tdap/Td (1 - Tdap) Never done   Zoster Vaccines- Shingrix (1 of 2) Never done   Pneumonia Vaccine 7+ Years old (1 of 1 - PCV) Never done   Health Maintenance Items Addressed: Declines vaccines except for pneumonia. Due for Hep C screening  Additional Screening:  Vision Screening: Recommended annual ophthalmology exams for early detection of glaucoma and other disorders of the eye.  Dental Screening: Recommended annual dental exams for proper oral hygiene  Community Resource Referral / Chronic Care Management: CRR required this visit?  No   CCM required this visit?  No   Plan:    I have personally reviewed and noted the following in the patient's chart:   Medical and social history Use of alcohol, tobacco or illicit drugs  Current medications and supplements including opioid prescriptions. Patient is not currently taking opioid prescriptions. Functional ability and status Nutritional status Physical activity Advanced directives List of other physicians Hospitalizations, surgeries, and ER visits in previous 12 months Vitals Screenings to include cognitive, depression, and falls Referrals and appointments  In addition, I have reviewed and discussed with patient certain preventive protocols, quality metrics, and best practice recommendations. A written personalized care plan for preventive services as well as general preventive health recommendations were provided to patient.   Areatha Beecham, LPN   12/24/452   After Visit Summary: (MyChart) Due to this  being a telephonic visit, the after visit summary with patients personalized plan was offered to patient via MyChart   Notes: Nothing significant to report at this time.

## 2023-08-26 NOTE — Patient Instructions (Signed)
 Ms. Kathryn Pruitt , Thank you for taking time out of your busy schedule to complete your Annual Wellness Visit with me. I enjoyed our conversation and look forward to speaking with you again next year. I, as well as your care team,  appreciate your ongoing commitment to your health goals. Please review the following plan we discussed and let me know if I can assist you in the future. Your Game plan/ To Do List    Referrals: If you haven't heard from the office you've been referred to, please reach out to them at the phone provided.  N/a Follow up Visits: Next Medicare AWV with our clinical staff: 08/30/2024 at 3:50   Have you seen your provider in the last 6 months (3 months if uncontrolled diabetes)? Yes Next Office Visit with your provider: 10/01/2023 at 2  Clinician Recommendations:  Aim for 30 minutes of exercise or brisk walking, 6-8 glasses of water, and 5 servings of fruits and vegetables each day. Don't forget to get pneumonia vaccine at next visit      This is a list of the screening recommended for you and due dates:  Health Maintenance  Topic Date Due   COVID-19 Vaccine (1) Never done   Hepatitis C Screening  Never done   DTaP/Tdap/Td vaccine (1 - Tdap) Never done   Zoster (Shingles) Vaccine (1 of 2) Never done   Pneumonia Vaccine (1 of 1 - PCV) Never done   Flu Shot  11/19/2023   Medicare Annual Wellness Visit  08/25/2024   Colon Cancer Screening  04/20/2030   DEXA scan (bone density measurement)  Completed   HPV Vaccine  Aged Out   Meningitis B Vaccine  Aged Out    Advanced directives: (ACP Link)Information on Advanced Care Planning can be found at [place]  Secretary of [facility] Advance Health Care Directives Advance Health Care Directives. [email or web address]  Advance Care Planning is important because it:  [x]  Makes sure you receive the medical care that is consistent with your values, goals, and preferences  [x]  It provides guidance to your family and loved ones and reduces their  decisional burden about whether or not they are making the right decisions based on your wishes.  Follow the link provided in your after visit summary or read over the paperwork we have mailed to you to help you started getting your Advance Directives in place. If you need assistance in completing these, please reach out to us  so that we can help you!  See attachments for Preventive Care and Fall Prevention Tips.

## 2023-08-29 ENCOUNTER — Other Ambulatory Visit: Payer: Self-pay | Admitting: Family

## 2023-08-29 DIAGNOSIS — E785 Hyperlipidemia, unspecified: Secondary | ICD-10-CM

## 2023-08-31 ENCOUNTER — Other Ambulatory Visit: Payer: Self-pay | Admitting: Family

## 2023-08-31 NOTE — Telephone Encounter (Signed)
 Copied from CRM [id number]. Topic: Clinical - Medication Refill >> Aug 31, 2023 11:46 AM Howard Macho wrote: Medication: celecoxib  (CELEBREX ) 100 MG capsule  Has the patient contacted their pharmacy? No (Agent: If no, request that the patient contact the pharmacy for the refill. If patient does not wish to contact the pharmacy document the reason why and proceed with request.) (Agent: If yes, when and what did the pharmacy advise?)  This is the patient's preferred pharmacy:  [facility] [address], [place] [phone]  Is this the correct pharmacy for this prescription? Yes If no, delete pharmacy and type the correct one.   Has the prescription been filled recently? Yes  Is the patient out of the medication? Yes  Has the patient been seen for an appointment in the last year OR does the patient have an upcoming appointment? Yes  Can we respond through MyChart? Yes  Agent: Please be advised that Rx refills may take up to 3 business days. We ask that you follow-up with your pharmacy.

## 2023-08-31 NOTE — Telephone Encounter (Signed)
 This RN noted patient called in about Celebrex  refill. This RN noted that med was sent to [facility] yesterday. This RN contacted patient to inform her that med is at [facility]. Patient states she will pick up med at [facility] and to disregard Wilmer Hash request. No further needs.

## 2023-09-07 ENCOUNTER — Other Ambulatory Visit: Payer: Self-pay | Admitting: *Deleted

## 2023-09-07 DIAGNOSIS — E7841 Elevated Lipoprotein(a): Secondary | ICD-10-CM

## 2023-09-07 DIAGNOSIS — E7800 Pure hypercholesterolemia, unspecified: Secondary | ICD-10-CM

## 2023-10-01 ENCOUNTER — Ambulatory Visit (INDEPENDENT_AMBULATORY_CARE_PROVIDER_SITE_OTHER): Admitting: Family

## 2023-10-01 ENCOUNTER — Other Ambulatory Visit (HOSPITAL_BASED_OUTPATIENT_CLINIC_OR_DEPARTMENT_OTHER): Payer: Self-pay

## 2023-10-01 ENCOUNTER — Encounter: Payer: Self-pay | Admitting: Family

## 2023-10-01 ENCOUNTER — Encounter: Payer: Self-pay | Admitting: Internal Medicine

## 2023-10-01 ENCOUNTER — Ambulatory Visit (HOSPITAL_BASED_OUTPATIENT_CLINIC_OR_DEPARTMENT_OTHER)
Admission: RE | Admit: 2023-10-01 | Discharge: 2023-10-01 | Disposition: A | Discharge status: Home / Self Care | Source: Ambulatory Visit | Attending: Family | Admitting: Family

## 2023-10-01 VITALS — BP 130/84 | HR 63 | Ht 65.0 in | Wt 175.2 lb

## 2023-10-01 DIAGNOSIS — M25551 Pain in right hip: Secondary | ICD-10-CM

## 2023-10-01 DIAGNOSIS — R899 Unspecified abnormal finding in specimens from other organs, systems and tissues: Secondary | ICD-10-CM

## 2023-10-01 DIAGNOSIS — M25552 Pain in left hip: Secondary | ICD-10-CM | POA: Diagnosis present

## 2023-10-01 DIAGNOSIS — R7303 Prediabetes: Secondary | ICD-10-CM | POA: Diagnosis not present

## 2023-10-01 DIAGNOSIS — N941 Unspecified dyspareunia: Secondary | ICD-10-CM

## 2023-10-01 MED ORDER — CELECOXIB 100 MG PO CAPS
100.0000 mg | ORAL_CAPSULE | Freq: Every day | ORAL | 1 refills | Status: DC
  Filled 2023-10-01: qty 30, 30d supply, fill #0
  Filled 2023-10-27: qty 30, 30d supply, fill #1

## 2023-10-01 MED ORDER — FLUCONAZOLE 150 MG PO TABS
ORAL_TABLET | ORAL | 0 refills | Status: DC
  Filled 2023-10-01: qty 2, 6d supply, fill #0

## 2023-10-01 NOTE — Progress Notes (Signed)
 Kathryn Pruitt is a 78 y.o. 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   Osteoarthrosis, pelvic region and thigh 08/06/2021   Incisional hernia 06/06/2021   Acquired absence of genital organ 03/03/2021   Acute cholecystitis 03/03/2021   Allergic rhinitis 03/03/2021   Cellulitis and abscess of leg 03/03/2021   Symptomatic menopausal or female climacteric states 03/03/2021   Sprain and strain of hip and thigh 03/03/2021   Somatic dysfunction of lumbar region 03/03/2021   Skin sensation disturbance 03/03/2021   Screen for colon cancer 03/03/2021   Reduced libido 03/03/2021   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   Pain in joint, pelvic region and thigh 03/03/2021   Nonallopathic lesion of sacral region 03/03/2021   Neuralgia 03/03/2021   Myalgia and myositis 03/03/2021   Microscopic hematuria 03/03/2021   Meralgia paresthetica 03/03/2021   Chronic pain syndrome 03/03/2021   Heartburn 03/03/2021   Hyperlipidemia 03/03/2021   Lower back pain 03/03/2021   Body mass index (BMI) 30.0-30.9, adult 12/11/2019   HSV (herpes simplex virus) anogenital infection 05/31/2019   Allergy to pollen 05/11/2019   Vertigo 09/21/2017   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   Lumbar radiculopathy 03/22/2015   Lumbar degenerative disc disease 03/22/2015   Abnormal mammogram see [facility] mm stable left breast mass since 2013 felt to be benign  08/13/2014   Osteopenia  Dexa done 07/20/2014  [facility] 08/06/2014   DJD (degenerative joint disease) 02/06/2014   HTN (hypertension) 02/06/2014   Tobacco use disorder  quit 20 years ago  02/06/2014   Marijuana use, episodic 02/06/2014   S/P hysterectomy with oophorectomy  has right ovary  02/06/2014   GERD (gastroesophageal reflux disease) 02/06/2014    Current  Outpatient Medications  Medication Sig Dispense Refill   Cholecalciferol (VITAMIN D3 PO) Take 1 tablet by mouth daily.     ezetimibe  (ZETIA ) 10 MG tablet Take 1 tablet (10 mg total) by mouth daily. 90 tablet 1   felodipine  (PLENDIL ) 10 MG 24 hr tablet TAKE 1 TABLET(10 MG) BY MOUTH DAILY 90 tablet 3   fluconazole (DIFLUCAN) 150 MG tablet Take 1 tablet daily as directed; repeat after 72 hours as directed 2 tablet 0   fosinopril  (MONOPRIL ) 20 MG tablet TAKE 1 TABLET(20 MG) BY MOUTH DAILY 90 tablet 3   gabapentin  (NEURONTIN ) 100 MG capsule Take 2 capsules (200 mg total) by mouth at bedtime as needed. 60 capsule 0   hydrochlorothiazide  (MICROZIDE ) 12.5 MG capsule Take 1 capsule (12.5 mg total) by mouth daily. 90 capsule 1   inclisiran (LEQVIO ) 284 MG/1.5ML SOSY injection Inject 284 mg into the skin once.     loratadine  (CLARITIN ) 10 MG tablet Take 10 mg by mouth daily.     ondansetron  (ZOFRAN ) 4 MG tablet Take 1 tablet (4 mg total) by mouth every 8 (eight) hours as needed for nausea or vomiting. 20 tablet 0   pantoprazole  (PROTONIX ) 40 MG tablet TAKE 1 TABLET(40 MG) BY MOUTH DAILY 90 tablet 3   celecoxib  (CELEBREX ) 100 MG capsule Take 1 capsule (100 mg total) by mouth daily. 30 capsule 1   No current facility-administered medications for this visit.    Allergies: Prednisone and Statins  Past Medical History:  Diagnosis Date   Arthritis    Hypertension     Past Surgical History:  Procedure Laterality Date   ABDOMINAL HYSTERECTOMY  1987   FOOT SURGERY Bilateral 04/20/2000   HERNIA REPAIR     TOTAL HIP ARTHROPLASTY Bilateral 04/20/2006   WISDOM TOOTH EXTRACTION  1986   XI ROBOTIC ASSISTED VENTRAL HERNIA N/A 06/06/2021   Procedure: ROBOTIC INCISIONAL HERNIA REPAIR WITH MESH;  Surgeon: Stechschulte, Avon Boers, MD;  Location: [facility];  Service: General;  Laterality: N/A;    Family History  Problem Relation Age of Onset   Lung cancer Mother    Hypertension Mother    Hypertension Brother    Bone  cancer Maternal Grandmother     Social History   Tobacco Use   Smoking status: Former    Current packs/day: 0.00    Average packs/day: 0.5 packs/day for 20.0 years (10.0 ttl pk-yrs)    Types: Cigarettes    Start date: 63    Quit date: 1998    Years since quitting: 27.4   Smokeless tobacco: Never  Substance Use Topics   Alcohol use: Not Currently    Subjective:   3 month follow up on chronic care needs; 1) Requesting pelvic ultrasound and X-rays of hips- just want to make sure everything okay. 2) Continuing to work with endocrine and cardiology;  Denies any chest pain, shortness of breath, blurred vision or headache   Objective:  Vitals:   10/01/23 0848  BP: 130/84  Pulse: 63  SpO2: 100%  Weight: 175 lb 3.2 oz (79.5 kg)  Height: 5' 5 (1.651 m)    General: Well developed, well nourished, in no acute distress  Skin : Warm and dry.  Head: Normocephalic and atraumatic  Lungs: Respirations unlabored; clear to auscultation bilaterally without wheeze, rales, rhonchi  CVS exam: normal rate and regular rhythm.  Musculoskeletal: No deformities; no active joint inflammation  Extremities: No edema, cyanosis, clubbing  Vessels: Symmetric bilaterally  Neurologic: Alert and oriented; speech intact; face symmetrical; moves all extremities well; CNII-XII intact without focal deficit   Assessment:  1. Dyspareunia, female   2. Bilateral hip pain   3. Pre-diabetes   4. Abnormal laboratory test result     Plan:  Will update pelvic ultrasound per patient request;  Will update bilateral hip X-ray;  Check CBC, CMP, Hgba1c;  Check B12 level today;   No follow-ups on file.  Orders Placed This Encounter  Procedures   US  Pelvic Complete With Transvaginal    Standing Status:   Future    Expiration Date:   09/30/2024    Reason for Exam (SYMPTOM  OR DIAGNOSIS REQUIRED):   dyspareunia    Preferred imaging location?:   [facility] [place]   DG HIPS BILAT W OR W/O PELVIS 2V     Standing Status:   Future    Expiration Date:   04/01/2024    Reason for Exam (SYMPTOM  OR DIAGNOSIS REQUIRED):   bilateral hip pain    Preferred imaging location?:   [facility] [place]   CBC with Differential/Platelet   Comp Met (CMET)   Hemoglobin A1c   B12    Requested Prescriptions   Signed Prescriptions Disp Refills   fluconazole (DIFLUCAN) 150 MG tablet 2 tablet 0    Sig: Take 1 tablet daily as directed; repeat after 72 hours as directed   celecoxib  (CELEBREX ) 100 MG capsule 30 capsule 1    Sig: Take 1 capsule (100 mg total) by mouth daily.

## 2023-10-02 ENCOUNTER — Ambulatory Visit (HOSPITAL_BASED_OUTPATIENT_CLINIC_OR_DEPARTMENT_OTHER)
Admission: RE | Admit: 2023-10-02 | Discharge: 2023-10-02 | Disposition: A | Discharge status: Home / Self Care | Source: Ambulatory Visit | Attending: Family | Admitting: Family

## 2023-10-02 DIAGNOSIS — N941 Unspecified dyspareunia: Secondary | ICD-10-CM | POA: Diagnosis present

## 2023-10-02 LAB — CBC WITH DIFFERENTIAL/PLATELET
Absolute Lymphocytes: 3326 {cells}/uL (ref 850–3900)
Absolute Monocytes: 431 {cells}/uL (ref 200–950)
Basophils Absolute: 39 {cells}/uL (ref 0–200)
Basophils Relative: 0.5 %
Eosinophils Absolute: 162 {cells}/uL (ref 15–500)
Eosinophils Relative: 2.1 %
HCT: 43.7 % (ref 35.0–45.0)
Hemoglobin: 14.2 g/dL (ref 11.7–15.5)
MCH: 29.8 pg (ref 27.0–33.0)
MCHC: 32.5 g/dL (ref 32.0–36.0)
MCV: 91.8 fL (ref 80.0–100.0)
MPV: 10.3 fL (ref 7.5–12.5)
Monocytes Relative: 5.6 %
Neutro Abs: 3742 {cells}/uL (ref 1500–7800)
Neutrophils Relative %: 48.6 %
Platelets: 369 10*3/uL (ref 140–400)
RBC: 4.76 10*6/uL (ref 3.80–5.10)
RDW: 14 % (ref 11.0–15.0)
Total Lymphocyte: 43.2 %
WBC: 7.7 10*3/uL (ref 3.8–10.8)

## 2023-10-02 LAB — COMPREHENSIVE METABOLIC PANEL WITH GFR
AG Ratio: 1.9 (calc) (ref 1.0–2.5)
ALT: 20 U/L (ref 6–29)
AST: 25 U/L (ref 10–35)
Albumin: 4.2 g/dL (ref 3.6–5.1)
Alkaline phosphatase (APISO): 75 U/L (ref 37–153)
BUN: 19 mg/dL (ref 7–25)
CO2: 28 mmol/L (ref 20–32)
Calcium: 9.7 mg/dL (ref 8.6–10.4)
Chloride: 103 mmol/L (ref 98–110)
Creat: 0.9 mg/dL (ref 0.60–1.00)
Globulin: 2.2 g/dL (ref 1.9–3.7)
Glucose, Bld: 97 mg/dL (ref 65–99)
Potassium: 4.7 mmol/L (ref 3.5–5.3)
Sodium: 142 mmol/L (ref 135–146)
Total Bilirubin: 0.6 mg/dL (ref 0.2–1.2)
Total Protein: 6.4 g/dL (ref 6.1–8.1)
eGFR: 67 mL/min/{1.73_m2} (ref >=60)

## 2023-10-02 LAB — HEMOGLOBIN A1C
Hgb A1c MFr Bld: 6.4 % — ABNORMAL HIGH (ref <5.7)
Mean Plasma Glucose: 137 mg/dL
eAG (mmol/L): 7.6 mmol/L

## 2023-10-02 LAB — VITAMIN B12: Vitamin B-12: 808 pg/mL (ref 200–1100)

## 2023-10-04 ENCOUNTER — Other Ambulatory Visit: Payer: Self-pay | Admitting: Family

## 2023-10-04 ENCOUNTER — Ambulatory Visit: Payer: Self-pay | Admitting: Family

## 2023-10-04 DIAGNOSIS — N3020 Other chronic cystitis without hematuria: Secondary | ICD-10-CM

## 2023-10-04 DIAGNOSIS — R9389 Abnormal findings on diagnostic imaging of other specified body structures: Secondary | ICD-10-CM

## 2023-10-06 ENCOUNTER — Ambulatory Visit: Payer: Medicare Other

## 2023-10-06 VITALS — BP 149/91 | HR 61 | Temp 98.0°F | Resp 16 | Ht 65.0 in | Wt 175.2 lb

## 2023-10-06 DIAGNOSIS — E782 Mixed hyperlipidemia: Secondary | ICD-10-CM

## 2023-10-06 DIAGNOSIS — E7841 Elevated Lipoprotein(a): Secondary | ICD-10-CM

## 2023-10-06 MED ORDER — INCLISIRAN SODIUM 284 MG/1.5ML ~~LOC~~ SOSY
284.0000 mg | PREFILLED_SYRINGE | Freq: Once | SUBCUTANEOUS | Status: AC
  Administered 2023-10-06: 284 mg via SUBCUTANEOUS
  Filled 2023-10-06: qty 1.5

## 2023-10-06 NOTE — Progress Notes (Signed)
 Diagnosis: Hyperlipidemia  Provider:  Mannam, Praveen MD  Procedure: Injection  Leqvio  (inclisiran), Dose: 284 mg, Site: subcutaneous, Number of injections: 1  Injection Site(s): Left lower quad. abdomen  Post Care: left lower quad abdominal injection  Discharge: Condition: Good, Destination: Home . AVS Provided  Performed by:  Lauran Pollard, LPN

## 2023-10-12 NOTE — Progress Notes (Signed)
 Letter has been printed and placed up front to mail out to pt.

## 2023-10-26 NOTE — Progress Notes (Signed)
 Chief Complaint: No chief complaint on file.   History of Present Illness:  Kathryn Pruitt is a 78 y.o. 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  This was done for pelvic discomfort and dyspareunia.  She does not have frequent urinary tract infections.  She denies significant lower urinary tract symptomatology.  She has had no blood in her urine.  She does not have history of tobacco use.  She has a good stream, most of the time feels like she empties out well.  No prior urologic history.  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   Past Medical History:  Past Medical History:  Diagnosis Date   Arthritis    Hypertension     Past Surgical History:  Past Surgical History:  Procedure Laterality Date   ABDOMINAL HYSTERECTOMY  1987   FOOT SURGERY Bilateral 04/20/2000   HERNIA REPAIR     TOTAL HIP ARTHROPLASTY Bilateral 04/20/2006   WISDOM TOOTH EXTRACTION  1986   XI ROBOTIC ASSISTED VENTRAL HERNIA N/A 06/06/2021   Procedure: ROBOTIC INCISIONAL HERNIA REPAIR WITH MESH;  Surgeon: Stechschulte, Deward PARAS, MD;  Location: [facility];  Service: General;  Laterality: N/A;    Allergies:  Allergies  Allergen Reactions   Prednisone Hives   Statins Other (See Comments)    Myalgia    Family History:  Family History  Problem Relation Age of Onset   Lung cancer Mother    Hypertension Mother    Hypertension Brother    Bone cancer Maternal Grandmother     Social History:  Social History   Tobacco Use   Smoking status: Former    Current packs/day: 0.00    Average packs/day: 0.5 packs/day for 20.0 years (10.0 ttl pk-yrs)    Types: Cigarettes    Start date: 54    Quit date: 1998    Years since quitting: 27.5   Smokeless tobacco: Never  Vaping Use   Vaping status: Never Used  Substance Use Topics   Alcohol use: Not Currently   Drug  use: Not Currently    Types: Marijuana    Review of symptoms:  Constitutional:  Negative for unexplained weight loss, night sweats, fever, chills ENT:  Negative for nose bleeds, sinus pain, painful swallowing CV:  Negative for chest pain, shortness of breath, exercise intolerance, palpitations, loss of consciousness Resp:  Negative for cough, wheezing, shortness of breath GI:  Negative for nausea, vomiting, diarrhea, bloody stools GU:  Positives noted in HPI; otherwise negative for gross hematuria, dysuria, urinary incontinence Neuro:  Negative for seizures, poor balance, limb weakness, slurred speech Psych:  Negative for lack of energy, depression, anxiety Endocrine:  Negative for polydipsia, polyuria, symptoms of hypoglycemia (dizziness, hunger, sweating) Hematologic:  Negative for anemia, purpura, petechia, prolonged or excessive bleeding, use of anticoagulants  Allergic:  Negative for difficulty breathing or choking as a result of exposure to anything; no shellfish allergy; no allergic response (rash/itch) to materials, foods  Physical exam: There were no vitals taken for this visit. GENERAL APPEARANCE:  Well appearing, well developed, well nourished, NAD HEENT: Atraumatic, Normocephalic. NECK: Normal appearance LUNGS: Normal inspiratory and expiratory excursion HEART: Regular Rate EXTREMITIES: Moves all extremities well.  Without clubbing, cyanosis, or edema. NEUROLOGIC:  Alert and oriented x 3, normal gait, CN II-XII grossly intact.  MENTAL STATUS:  Appropriate. SKIN:  Warm, dry and intact.  Results:  I have reviewed prior patient's records  I have reviewed referring/prior physicians records  U/A reviewed  I have reviewed bladder scan-residual urine volume 55 mL  I have reviewed prior urine cultures-culture from May, 2024 negative  I reviewed prior imaging studies-looked ultrasound images reviewed.  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  Assessment: Bladder diverticulum.  Seen incidentally on pelvic ultrasound.  No abnormal findings within the diverticulum, no significant lower urinary tract symptoms, no history of hematuria.  These are less common in women than men, but at this point I have no concern of underlying abnormalities.   Plan: I reassured her about her diverticulum. This is an incidental finding. She has no rbcs on u/a today, no GU sx's, no high-risk exposure histoy. I do not think that she needs further evaluation. [name] can return prn.

## 2023-10-27 ENCOUNTER — Encounter: Payer: Self-pay | Admitting: Internal Medicine

## 2023-10-27 ENCOUNTER — Other Ambulatory Visit (HOSPITAL_BASED_OUTPATIENT_CLINIC_OR_DEPARTMENT_OTHER): Payer: Self-pay

## 2023-10-27 ENCOUNTER — Ambulatory Visit (INDEPENDENT_AMBULATORY_CARE_PROVIDER_SITE_OTHER): Admitting: Urology

## 2023-10-27 VITALS — BP 145/92 | HR 62 | Ht 65.0 in | Wt 175.0 lb

## 2023-10-27 DIAGNOSIS — N323 Diverticulum of bladder: Secondary | ICD-10-CM

## 2023-10-27 LAB — MICROSCOPIC EXAMINATION
Epithelial Cells (non renal): >10 /HPF — ABNORMAL HIGH (ref 0–10)
RBC, Urine: NONE SEEN /HPF (ref 0–2)

## 2023-10-27 LAB — URINALYSIS, ROUTINE W REFLEX MICROSCOPIC
Bilirubin, UA: NEGATIVE
Glucose, UA: NEGATIVE
Ketones, UA: NEGATIVE
Nitrite, UA: NEGATIVE
Protein,UA: NEGATIVE
Specific Gravity, UA: 1.01 (ref 1.005–1.030)
Urobilinogen, Ur: 0.2 mg/dL (ref 0.2–1.0)
pH, UA: 6 (ref 5.0–7.5)

## 2023-10-27 LAB — BLADDER SCAN AMB NON-IMAGING: Scan Result: 55

## 2023-12-05 ENCOUNTER — Other Ambulatory Visit: Payer: Self-pay

## 2023-12-05 ENCOUNTER — Emergency Department (HOSPITAL_BASED_OUTPATIENT_CLINIC_OR_DEPARTMENT_OTHER)

## 2023-12-05 ENCOUNTER — Encounter (HOSPITAL_BASED_OUTPATIENT_CLINIC_OR_DEPARTMENT_OTHER): Payer: Self-pay

## 2023-12-05 ENCOUNTER — Inpatient Hospital Stay (HOSPITAL_BASED_OUTPATIENT_CLINIC_OR_DEPARTMENT_OTHER)
Admission: EM | Admit: 2023-12-05 | Discharge: 2023-12-08 | DRG: 440 | Disposition: A | Discharge status: Home / Self Care | Attending: Internal Medicine | Admitting: Internal Medicine

## 2023-12-05 DIAGNOSIS — Z888 Allergy status to other drugs, medicaments and biological substances status: Secondary | ICD-10-CM | POA: Diagnosis not present

## 2023-12-05 DIAGNOSIS — K219 Gastro-esophageal reflux disease without esophagitis: Secondary | ICD-10-CM | POA: Diagnosis present

## 2023-12-05 DIAGNOSIS — Z801 Family history of malignant neoplasm of trachea, bronchus and lung: Secondary | ICD-10-CM | POA: Diagnosis not present

## 2023-12-05 DIAGNOSIS — E785 Hyperlipidemia, unspecified: Secondary | ICD-10-CM | POA: Diagnosis present

## 2023-12-05 DIAGNOSIS — K760 Fatty (change of) liver, not elsewhere classified: Secondary | ICD-10-CM | POA: Diagnosis present

## 2023-12-05 DIAGNOSIS — T502X5A Adverse effect of carbonic-anhydrase inhibitors, benzothiadiazides and other diuretics, initial encounter: Secondary | ICD-10-CM | POA: Diagnosis present

## 2023-12-05 DIAGNOSIS — K859 Acute pancreatitis without necrosis or infection, unspecified: Principal | ICD-10-CM | POA: Diagnosis present

## 2023-12-05 DIAGNOSIS — H903 Sensorineural hearing loss, bilateral: Secondary | ICD-10-CM | POA: Diagnosis present

## 2023-12-05 DIAGNOSIS — K449 Diaphragmatic hernia without obstruction or gangrene: Secondary | ICD-10-CM | POA: Diagnosis present

## 2023-12-05 DIAGNOSIS — Z90710 Acquired absence of both cervix and uterus: Secondary | ICD-10-CM

## 2023-12-05 DIAGNOSIS — Y92009 Unspecified place in unspecified non-institutional (private) residence as the place of occurrence of the external cause: Secondary | ICD-10-CM | POA: Diagnosis not present

## 2023-12-05 DIAGNOSIS — F1290 Cannabis use, unspecified, uncomplicated: Secondary | ICD-10-CM | POA: Diagnosis present

## 2023-12-05 DIAGNOSIS — Z8249 Family history of ischemic heart disease and other diseases of the circulatory system: Secondary | ICD-10-CM

## 2023-12-05 DIAGNOSIS — Z7151 Drug abuse counseling and surveillance of drug abuser: Secondary | ICD-10-CM | POA: Diagnosis not present

## 2023-12-05 DIAGNOSIS — Z96643 Presence of artificial hip joint, bilateral: Secondary | ICD-10-CM | POA: Diagnosis present

## 2023-12-05 DIAGNOSIS — E876 Hypokalemia: Secondary | ICD-10-CM | POA: Diagnosis present

## 2023-12-05 DIAGNOSIS — K853 Drug induced acute pancreatitis without necrosis or infection: Principal | ICD-10-CM | POA: Diagnosis present

## 2023-12-05 DIAGNOSIS — Z87891 Personal history of nicotine dependence: Secondary | ICD-10-CM | POA: Diagnosis not present

## 2023-12-05 DIAGNOSIS — K8500 Idiopathic acute pancreatitis without necrosis or infection: Secondary | ICD-10-CM | POA: Diagnosis not present

## 2023-12-05 DIAGNOSIS — Z79899 Other long term (current) drug therapy: Secondary | ICD-10-CM

## 2023-12-05 DIAGNOSIS — I10 Essential (primary) hypertension: Secondary | ICD-10-CM | POA: Diagnosis present

## 2023-12-05 DIAGNOSIS — R112 Nausea with vomiting, unspecified: Secondary | ICD-10-CM

## 2023-12-05 LAB — CBC WITH DIFFERENTIAL/PLATELET
Abs Immature Granulocytes: 0.04 K/uL (ref 0.00–0.07)
Basophils Absolute: 0.1 K/uL (ref 0.0–0.1)
Basophils Relative: 0 %
Eosinophils Absolute: 0.1 K/uL (ref 0.0–0.5)
Eosinophils Relative: 0 %
HCT: 46.6 % — ABNORMAL HIGH (ref 36.0–46.0)
Hemoglobin: 16.1 g/dL — ABNORMAL HIGH (ref 12.0–15.0)
Immature Granulocytes: 0 %
Lymphocytes Relative: 26 %
Lymphs Abs: 4.2 K/uL — ABNORMAL HIGH (ref 0.7–4.0)
MCH: 30.3 pg (ref 26.0–34.0)
MCHC: 34.5 g/dL (ref 30.0–36.0)
MCV: 87.6 fL (ref 80.0–100.0)
Monocytes Absolute: 1.2 K/uL — ABNORMAL HIGH (ref 0.1–1.0)
Monocytes Relative: 7 %
Neutro Abs: 10.6 K/uL — ABNORMAL HIGH (ref 1.7–7.7)
Neutrophils Relative %: 67 %
Platelets: 350 K/uL (ref 150–400)
RBC: 5.32 MIL/uL — ABNORMAL HIGH (ref 3.87–5.11)
RDW: 14.4 % (ref 11.5–15.5)
WBC: 16.1 K/uL — ABNORMAL HIGH (ref 4.0–10.5)
nRBC: 0 % (ref 0.0–0.2)

## 2023-12-05 LAB — MAGNESIUM: Magnesium: 2 mg/dL (ref 1.7–2.4)

## 2023-12-05 LAB — URINALYSIS, ROUTINE W REFLEX MICROSCOPIC
Bilirubin Urine: NEGATIVE
Glucose, UA: NEGATIVE mg/dL
Ketones, ur: 40 mg/dL — AB
Leukocytes,Ua: NEGATIVE
Nitrite: NEGATIVE
Protein, ur: NEGATIVE mg/dL
Specific Gravity, Urine: 1.015 (ref 1.005–1.030)
pH: 8.5 — ABNORMAL HIGH (ref 5.0–8.0)

## 2023-12-05 LAB — COMPREHENSIVE METABOLIC PANEL WITH GFR
ALT: 28 U/L (ref 0–44)
AST: 33 U/L (ref 15–41)
Albumin: 4.8 g/dL (ref 3.5–5.0)
Alkaline Phosphatase: 108 U/L (ref 38–126)
Anion gap: 17 — ABNORMAL HIGH (ref 5–15)
BUN: 12 mg/dL (ref 8–23)
CO2: 23 mmol/L (ref 22–32)
Calcium: 10 mg/dL (ref 8.9–10.3)
Chloride: 101 mmol/L (ref 98–111)
Creatinine, Ser: 0.82 mg/dL (ref 0.44–1.00)
GFR, Estimated: >60 mL/min (ref >60)
Glucose, Bld: 119 mg/dL — ABNORMAL HIGH (ref 70–99)
Potassium: 3.2 mmol/L — ABNORMAL LOW (ref 3.5–5.1)
Sodium: 141 mmol/L (ref 135–145)
Total Bilirubin: 1.1 mg/dL (ref 0.0–1.2)
Total Protein: 8.1 g/dL (ref 6.5–8.1)

## 2023-12-05 LAB — URINE DRUG SCREEN
Amphetamines: NOT DETECTED
Barbiturates: NOT DETECTED
Benzodiazepines: NOT DETECTED
Cocaine: NOT DETECTED
Fentanyl: NOT DETECTED
Methadone Scn, Ur: NOT DETECTED
Opiates: DETECTED — AB
Tetrahydrocannabinol: DETECTED — AB

## 2023-12-05 LAB — URINALYSIS, MICROSCOPIC (REFLEX)

## 2023-12-05 LAB — LIPASE, BLOOD: Lipase: 60 U/L — ABNORMAL HIGH (ref 11–51)

## 2023-12-05 LAB — TRIGLYCERIDES: Triglycerides: 77 mg/dL (ref <150)

## 2023-12-05 MED ORDER — ALBUTEROL SULFATE (2.5 MG/3ML) 0.083% IN NEBU: 2.5000 mg | INHALATION_SOLUTION | RESPIRATORY_TRACT | Status: DC | PRN

## 2023-12-05 MED ORDER — ONDANSETRON HCL 4 MG/2ML IJ SOLN
4.0000 mg | Freq: Four times a day (QID) | INTRAMUSCULAR | Status: DC | PRN
  Administered 2023-12-08: 4 mg via INTRAVENOUS
  Filled 2023-12-05 (×2): qty 2

## 2023-12-05 MED ORDER — METOCLOPRAMIDE HCL 5 MG/ML IJ SOLN
10.0000 mg | Freq: Four times a day (QID) | INTRAMUSCULAR | Status: DC | PRN
  Administered 2023-12-05 – 2023-12-06 (×3): 10 mg via INTRAVENOUS
  Filled 2023-12-05 (×4): qty 2

## 2023-12-05 MED ORDER — TRAZODONE HCL 50 MG PO TABS
25.0000 mg | ORAL_TABLET | Freq: Every evening | ORAL | Status: DC | PRN
  Filled 2023-12-05: qty 1

## 2023-12-05 MED ORDER — ONDANSETRON HCL 4 MG/2ML IJ SOLN
4.0000 mg | Freq: Once | INTRAMUSCULAR | Status: AC
  Administered 2023-12-05: 4 mg via INTRAVENOUS
  Filled 2023-12-05: qty 2

## 2023-12-05 MED ORDER — LACTATED RINGERS IV SOLN: INTRAVENOUS | Status: AC

## 2023-12-05 MED ORDER — ACETAMINOPHEN 325 MG PO TABS
650.0000 mg | ORAL_TABLET | Freq: Four times a day (QID) | ORAL | Status: DC | PRN
  Administered 2023-12-06: 650 mg via ORAL
  Filled 2023-12-05: qty 2

## 2023-12-05 MED ORDER — FAMOTIDINE IN NACL 20-0.9 MG/50ML-% IV SOLN
20.0000 mg | Freq: Once | INTRAVENOUS | Status: AC
  Administered 2023-12-05: 20 mg via INTRAVENOUS
  Filled 2023-12-05: qty 50

## 2023-12-05 MED ORDER — IOHEXOL 300 MG/ML  SOLN
75.0000 mL | Freq: Once | INTRAMUSCULAR | Status: AC | PRN
  Administered 2023-12-05: 75 mL via INTRAVENOUS

## 2023-12-05 MED ORDER — MORPHINE SULFATE (PF) 4 MG/ML IV SOLN
4.0000 mg | Freq: Once | INTRAVENOUS | Status: AC
  Administered 2023-12-05: 4 mg via INTRAVENOUS
  Filled 2023-12-05: qty 1

## 2023-12-05 MED ORDER — PANTOPRAZOLE SODIUM 40 MG IV SOLR
40.0000 mg | INTRAVENOUS | Status: DC
  Administered 2023-12-05: 40 mg via INTRAVENOUS
  Filled 2023-12-05: qty 10

## 2023-12-05 MED ORDER — SODIUM CHLORIDE 0.9 % IV BOLUS
1000.0000 mL | Freq: Once | INTRAVENOUS | Status: AC
  Administered 2023-12-05: 1000 mL via INTRAVENOUS

## 2023-12-05 MED ORDER — HYDROMORPHONE HCL 1 MG/ML IJ SOLN
1.0000 mg | INTRAMUSCULAR | Status: DC | PRN
  Administered 2023-12-06: 1 mg via INTRAVENOUS
  Filled 2023-12-05 (×3): qty 1

## 2023-12-05 MED ORDER — ACETAMINOPHEN 650 MG RE SUPP: 650.0000 mg | Freq: Four times a day (QID) | RECTAL | Status: DC | PRN

## 2023-12-05 MED ORDER — ENOXAPARIN SODIUM 40 MG/0.4ML IJ SOSY
40.0000 mg | PREFILLED_SYRINGE | Freq: Every day | INTRAMUSCULAR | Status: DC
  Administered 2023-12-05 – 2023-12-07 (×3): 40 mg via SUBCUTANEOUS
  Filled 2023-12-05 (×3): qty 0.4

## 2023-12-05 NOTE — ED Notes (Signed)
 Called to give report. Nurse unable to take call at this time and will call back for report.

## 2023-12-05 NOTE — ED Provider Notes (Signed)
 [facility] Provider Note  CSN: [id number] Arrival date & time: 12/05/23 1010  Chief Complaint(s) Abdominal Pain  HPI Kathryn Pruitt is a 78 y.o. female with past medical history as below, significant for hypertension, polycythemia, HSV, marijuana use, hysterectomy, HLD who presents to the ED with complaint of abdominal pain  Patient reports pain began yesterday, epigastric, periumbilical, she felt nauseated and there saw vomit last night which did improve her symptoms somewhat.  She denies fevers, chills, sick contacts, recent travel or suspicious p.o. intake.  Prior abdominal surgery includes hernia repair and hysterectomy.  She denies any abnormal vaginal bleeding or discharge.  No change in bowel or bladder function.  No BRBPR or melena.  Emesis nonbloody nonbilious.  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   Osteoarthrosis, pelvic region and thigh 08/06/2021   Incisional hernia 06/06/2021   Acquired absence of genital organ 03/03/2021   Acute cholecystitis 03/03/2021   Allergic rhinitis 03/03/2021   Cellulitis and abscess of leg 03/03/2021   Symptomatic menopausal or female climacteric states 03/03/2021   Sprain and strain of hip and thigh 03/03/2021   Somatic dysfunction of lumbar region 03/03/2021   Skin sensation disturbance 03/03/2021   Screen for colon cancer 03/03/2021   Reduced libido 03/03/2021   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   Pain in joint, pelvic region and thigh 03/03/2021   Nonallopathic lesion of sacral region 03/03/2021   Neuralgia 03/03/2021   Myalgia and myositis 03/03/2021   Microscopic hematuria 03/03/2021   Meralgia paresthetica 03/03/2021   Chronic pain syndrome  03/03/2021   Heartburn 03/03/2021   Hyperlipidemia 03/03/2021   Lower back pain 03/03/2021   Body mass index (BMI) 30.0-30.9, adult 12/11/2019   HSV (herpes simplex virus) anogenital infection 05/31/2019   Allergy to pollen 05/11/2019   Vertigo 09/21/2017   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   Lumbar radiculopathy 03/22/2015   Lumbar degenerative disc disease 03/22/2015   Abnormal mammogram see [facility] mm stable left breast mass since 2013 felt to be benign  08/13/2014   Osteopenia  Dexa done 07/20/2014  [facility] 08/06/2014   DJD (degenerative joint disease) 02/06/2014   HTN (hypertension) 02/06/2014   Tobacco use disorder  quit 20 years ago  02/06/2014   Marijuana use, episodic 02/06/2014   S/P hysterectomy with oophorectomy  has right ovary  02/06/2014   GERD (gastroesophageal reflux disease) 02/06/2014   Home Medication(s) Prior to Admission medications   Medication Sig Start Date End Date Taking? Authorizing Provider  celecoxib  (CELEBREX ) 100 MG capsule Take 1 capsule (100 mg total) by mouth daily. 10/01/23   Jason Leita Repine, FNP  Cholecalciferol (VITAMIN D3 PO) Take 1 tablet by mouth daily.    [provider]  ezetimibe  (ZETIA ) 10 MG tablet Take 1 tablet (10 mg total) by mouth daily. 08/30/23   Jason Leita Repine, FNP  felodipine  (PLENDIL ) 10 MG 24 hr tablet TAKE 1 TABLET(10 MG) BY MOUTH DAILY 11/30/22   Jason Leita Repine, FNP  fluconazole  (DIFLUCAN ) 150 MG tablet Take 1 tablet daily as directed; repeat after 72 hours as directed 10/01/23   Jason Leita Repine, FNP  fosinopril  (MONOPRIL ) 20 MG tablet  TAKE 1 TABLET(20 MG) BY MOUTH DAILY 03/02/23   Jason Leita Repine, FNP  gabapentin  (NEURONTIN ) 100 MG capsule Take 2 capsules (200 mg total) by mouth at bedtime as needed. 06/25/23   Jason Leita Repine, FNP  hydrochlorothiazide  (MICROZIDE ) 12.5 MG capsule Take 1 capsule (12.5 mg total)  by mouth daily. 08/30/23   Jason Leita Repine, FNP  inclisiran (LEQVIO ) 284 MG/1.5ML SOSY injection Inject 284 mg into the skin once.    [provider]  loratadine  (CLARITIN ) 10 MG tablet Take 10 mg by mouth daily.    [provider]  ondansetron  (ZOFRAN ) 4 MG tablet Take 1 tablet (4 mg total) by mouth every 8 (eight) hours as needed for nausea or vomiting. 06/25/23   Jason Leita Repine, FNP  pantoprazole  (PROTONIX ) 40 MG tablet TAKE 1 TABLET(40 MG) BY MOUTH DAILY 05/31/23   Jason Leita Repine, FNP  metoCLOPramide  (REGLAN ) 10 MG tablet Take 10 mg by mouth 2 (two) times daily. 06/22/17 12/01/19  [provider]                                                                                                                                    Past Surgical History Past Surgical History:  Procedure Laterality Date   ABDOMINAL HYSTERECTOMY  1987   FOOT SURGERY Bilateral 04/20/2000   HERNIA REPAIR     TOTAL HIP ARTHROPLASTY Bilateral 04/20/2006   WISDOM TOOTH EXTRACTION  1986   XI ROBOTIC ASSISTED VENTRAL HERNIA N/A 06/06/2021   Procedure: ROBOTIC INCISIONAL HERNIA REPAIR WITH MESH;  Surgeon: Stechschulte, Deward PARAS, MD;  Location: [facility];  Service: General;  Laterality: N/A;   Family History Family History  Problem Relation Age of Onset   Lung cancer Mother    Hypertension Mother    Hypertension Brother    Bone cancer Maternal Grandmother     Social History Social History   Tobacco Use   Smoking status: Former    Current packs/day: 0.00    Average packs/day: 0.5 packs/day for 20.0 years (10.0 ttl pk-yrs)    Types: Cigarettes    Start date: 28    Quit date: 1998    Years since quitting: 27.6   Smokeless tobacco: Never  Vaping Use   Vaping status: Never Used  Substance Use Topics   Alcohol use: Not Currently   Drug use: Not Currently    Types: Marijuana   Allergies Prednisone and Statins  Review of Systems A thorough review of systems was  obtained and all systems are negative except as noted in the HPI and PMH.   Physical Exam Vital Signs  I have reviewed the triage vital signs BP (!) 162/102   Pulse 67   Temp 98.2 F (36.8 C) (Oral)   Resp 18   Wt 78 kg   SpO2 100%   BMI 28.62 kg/m  Physical Exam Vitals and nursing note reviewed.  Constitutional:  General: She is not in acute distress.    Appearance: Normal appearance. She is well-developed. She is not ill-appearing.  HENT:     Head: Normocephalic and atraumatic.     Right Ear: External ear normal.     Left Ear: External ear normal.     Nose: Nose normal.     Mouth/Throat:     Mouth: Mucous membranes are moist.  Eyes:     General: No scleral icterus.       Right eye: No discharge.        Left eye: No discharge.  Cardiovascular:     Rate and Rhythm: Normal rate.  Pulmonary:     Effort: Pulmonary effort is normal. No respiratory distress.     Breath sounds: No stridor.  Abdominal:     General: Abdomen is flat. There is no distension.     Tenderness: There is abdominal tenderness in the right upper quadrant, right lower quadrant and epigastric area. There is no guarding or rebound.  Musculoskeletal:        General: No deformity.     Cervical back: No rigidity.  Skin:    General: Skin is warm and dry.     Coloration: Skin is not cyanotic, jaundiced or pale.  Neurological:     Mental Status: She is alert and oriented to person, place, and time.     GCS: GCS eye subscore is 4. GCS verbal subscore is 5. GCS motor subscore is 6.  Psychiatric:        Speech: Speech normal.        Behavior: Behavior normal. Behavior is cooperative.     ED Results and Treatments Labs (all labs ordered are listed, but only abnormal results are displayed) Labs Reviewed  CBC WITH DIFFERENTIAL/PLATELET - Abnormal; Notable for the following components:      Result Value   WBC 16.1 (*)    RBC 5.32 (*)    Hemoglobin 16.1 (*)    HCT 46.6 (*)    Neutro Abs 10.6 (*)     Lymphs Abs 4.2 (*)    Monocytes Absolute 1.2 (*)    All other components within normal limits  COMPREHENSIVE METABOLIC PANEL WITH GFR - Abnormal; Notable for the following components:   Potassium 3.2 (*)    Glucose, Bld 119 (*)    Anion gap 17 (*)    All other components within normal limits  LIPASE, BLOOD - Abnormal; Notable for the following components:   Lipase 60 (*)    All other components within normal limits  URINALYSIS, ROUTINE W REFLEX MICROSCOPIC - Abnormal; Notable for the following components:   pH 8.5 (*)    Hgb urine dipstick TRACE (*)    Ketones, ur 40 (*)    All other components within normal limits  URINE DRUG SCREEN - Abnormal; Notable for the following components:   Opiates DETECTED (*)    Tetrahydrocannabinol DETECTED (*)    All other components within normal limits  URINALYSIS, MICROSCOPIC (REFLEX) - Abnormal; Notable for the following components:   Bacteria, UA MANY (*)    All other components within normal limits  TRIGLYCERIDES  Radiology CT ABDOMEN PELVIS W CONTRAST Result Date: 12/05/2023 CLINICAL DATA:  Mid abdominal pain and emesis since yesterday EXAM: CT ABDOMEN AND PELVIS WITH CONTRAST TECHNIQUE: Multidetector CT imaging of the abdomen and pelvis was performed using the standard protocol following bolus administration of intravenous contrast. RADIATION DOSE REDUCTION: This exam was performed according to the departmental dose-optimization program which includes automated exposure control, adjustment of the mA and/or kV according to patient size and/or use of iterative reconstruction technique. CONTRAST:  75mL OMNIPAQUE  IOHEXOL  300 MG/ML  SOLN COMPARISON:  04/24/2022 FINDINGS: Lower chest: No acute pleural or parenchymal lung disease. Hepatobiliary: Decreased liver attenuation compatible with hepatic steatosis. Gallbladder is unremarkable. No  biliary duct dilation. Pancreas: There is mild peripancreatic fat stranding adjacent to the uncinate process, consistent with acute uncomplicated pancreatitis. Remainder of the pancreas is unremarkable. No biliary duct dilation. Spleen: Stable splenic cyst or hemangioma. No evidence of splenomegaly. Adrenals/Urinary Tract: Simple appearing left renal cortical cysts do not require specific imaging follow-up. The right kidney is unremarkable. No urinary tract calculi or obstructive uropathy within either kidney. The adrenals appear normal. 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 Stomach/Bowel: No bowel obstruction or ileus. Normal appendix right lower quadrant. No bowel wall thickening or inflammatory change. Small hiatal hernia. Vascular/Lymphatic: Aortic atherosclerosis. No enlarged abdominal or pelvic lymph nodes. 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 Prior hysterectomy. No adnexal mass. Other: No free fluid or free intraperitoneal gas. No abdominal wall hernia. Musculoskeletal: Unremarkable bilateral hip arthroplasties. No acute or destructive bony abnormalities. IMPRESSION: 1. Peripancreatic fat stranding along the uncinate process, consistent with acute uncomplicated pancreatitis. 2. Mild hepatic steatosis. 3. Small hiatal hernia. 4.  Aortic Atherosclerosis (ICD10-I70.0). Electronically Signed   By: Ozell Daring M.D.   On: 12/05/2023 11:48    Pertinent labs & imaging results that were available during my care of the patient were reviewed by me and considered in my medical decision making (see MDM for details).  Medications Ordered in ED Medications  lactated ringers  infusion (has no administration in time range)  ondansetron  (ZOFRAN ) injection 4 mg (4 mg Intravenous Given 12/05/23 1040)  famotidine  (PEPCID ) IVPB 20 mg premix (0 mg Intravenous Stopped 12/05/23 1114)  morphine   (PF) 4 MG/ML injection 4 mg (4 mg Intravenous Given 12/05/23 1041)  sodium chloride  0.9 % bolus 1,000 mL (0 mLs Intravenous Stopped 12/05/23 1458)  iohexol  (OMNIPAQUE ) 300 MG/ML solution 75 mL (75 mLs Intravenous Contrast Given 12/05/23 1123)  ondansetron  (ZOFRAN ) injection 4 mg (4 mg Intravenous Given 12/05/23 1323)                                                                                                                                     Procedures Procedures  (including critical care time)  Medical Decision Making / ED Course    Medical Decision Making:  Ona Grosso is a 78 y.o. female with past medical history as below, significant for hypertension, polycythemia, HSV, marijuana use, hysterectomy, HLD who presents to the ED with complaint of abdominal pain. The complaint involves an extensive differential diagnosis and also carries with it a high risk of complications and morbidity.  Serious etiology was considered. Ddx includes but is not limited to: Differential diagnosis includes but is not exclusive to acute cholecystitis, intrathoracic causes for epigastric abdominal pain, gastritis, duodenitis, pancreatitis, small bowel or large bowel obstruction, abdominal aortic aneurysm, hernia, gastritis, etc.   Complete initial physical exam performed, notably the patient was in no acute distress, HDS.    Reviewed and confirmed nursing documentation for past medical history, family history, social history.  Vital signs reviewed.    Abdominal pain Intractable nausea and vomiting  Pancreatitis > - Epigastric and right upper quadrant, right lower quadrant, periumbilical pain associated with nausea and emesis x 1; no reported bleeding - She is HDS, no fever, no jaundice. - CT w/ pancreatitis, no pseudocyst; lipase mild elev. Pt w/ ongoing n/v epig pain; wbc 16, no fever. Recommend admission for pancreatitis, unclear etiology at this point    Admit [facility]                      Additional history obtained: -Additional history obtained from family -External records from outside source obtained and reviewed including: Chart review including previous notes, labs, imaging, consultation notes including  Home medications   Lab Tests: -I ordered, reviewed, and interpreted labs.   The pertinent results include:   Labs Reviewed  CBC WITH DIFFERENTIAL/PLATELET - Abnormal; Notable for the following components:      Result Value   WBC 16.1 (*)    RBC 5.32 (*)    Hemoglobin 16.1 (*)    HCT 46.6 (*)    Neutro Abs 10.6 (*)    Lymphs Abs 4.2 (*)    Monocytes Absolute 1.2 (*)    All other components within normal limits  COMPREHENSIVE METABOLIC PANEL WITH GFR - Abnormal; Notable for the following components:   Potassium 3.2 (*)    Glucose, Bld 119 (*)    Anion gap 17 (*)    All other components within normal limits  LIPASE, BLOOD - Abnormal; Notable for the following components:   Lipase 60 (*)    All other components within normal limits  URINALYSIS, ROUTINE W REFLEX MICROSCOPIC - Abnormal; Notable for the following components:   pH 8.5 (*)    Hgb urine dipstick TRACE (*)    Ketones, ur 40 (*)    All other components within normal limits  URINE DRUG SCREEN - Abnormal; Notable for the following components:   Opiates DETECTED (*)    Tetrahydrocannabinol DETECTED (*)    All other components within normal limits  URINALYSIS, MICROSCOPIC (REFLEX) - Abnormal; Notable for the following components:   Bacteria, UA MANY (*)    All other components within normal limits  TRIGLYCERIDES    Notable for wbc + lipase +  EKG   EKG Interpretation Date/Time:  Sunday December 05 2023 10:29:53 EDT Ventricular Rate:  66 PR Interval:  166 QRS Duration:  80 QT Interval:  459 QTC Calculation: 481 R Axis:   11  Text Interpretation: Sinus rhythm Confirmed by Elnor Savant ([id number]) on 12/05/2023 10:32:05 AM         Imaging Studies ordered: I  ordered imaging studies including CTAP I independently visualized the following imaging with scope of  interpretation limited to determining acute life threatening conditions related to emergency care; findings noted above I agree with the radiologist interpretation If any imaging was obtained with contrast I closely monitored patient for any possible adverse reaction a/w contrast administration in the emergency department   Medicines ordered and prescription drug management: Meds ordered this encounter  Medications   ondansetron  (ZOFRAN ) injection 4 mg   famotidine  (PEPCID ) IVPB 20 mg premix   morphine  (PF) 4 MG/ML injection 4 mg   sodium chloride  0.9 % bolus 1,000 mL   iohexol  (OMNIPAQUE ) 300 MG/ML solution 75 mL   ondansetron  (ZOFRAN ) injection 4 mg   lactated ringers  infusion    -I have reviewed the patients home medicines and have made adjustments as needed   Consultations Obtained: I requested consultation with the hospitalist,  and discussed lab and imaging findings as well as pertinent plan    Cardiac Monitoring: Continuous pulse oximetry interpreted by myself, 100% on ra.    Social Determinants of Health:  Diagnosis or treatment significantly limited by social determinants of health: former smoker, THC use   Reevaluation: After the interventions noted above, I reevaluated the patient and found that they have improved  Co morbidities that complicate the patient evaluation  Past Medical History:  Diagnosis Date   Arthritis    Hypertension       Dispostion: Disposition decision including need for hospitalization was considered, and patient was admitted    Final Clinical Impression(s) / ED Diagnoses Final diagnoses:  Acute pancreatitis, unspecified complication status, unspecified pancreatitis type  Hepatic steatosis  Intractable nausea and vomiting  Hypokalemia        Elnor Jayson LABOR, DO 12/05/23 1557

## 2023-12-05 NOTE — ED Notes (Signed)
 ED TO INPATIENT HANDOFF REPORT  ED Nurse Name and Phone #: Thornell PEAK [phone]  S Name/Age/Gender Kathryn Pruitt 78 y.o. female Room/Bed: MH07/MH07  Code Status   Code Status: Prior  Home/SNF/Other Home Patient oriented to: self, place, time, and situation Is this baseline? Yes   Triage Complete: Triage complete  Chief Complaint Acute pancreatitis [K85.90]  Triage Note Reports mid abd pain, emesis since last night.  Denies constipation,diarrhea or urinary symptoms   Allergies Allergies  Allergen Reactions   Prednisone Hives   Statins Other (See Comments)    Myalgia    Level of Care/Admitting Diagnosis ED Disposition     ED Disposition  Admit   Condition  --   Comment  Hospital Area: [facility] [[id number]]  Level of Care: Med-Surg [16]  May admit patient to [facility] or [facility] if equivalent level of care is available:: Yes  Interfacility transfer: Yes  Covid Evaluation: Asymptomatic - no recent exposure (last 10 days) testing not required  Diagnosis: Acute pancreatitis [577.0.ICD-9-CM]  Admitting Physician: ZELLA KATHA HERO [[id number]]  Attending Physician: [facility], MIR Hart.Gula [[id number]]  Certification:: I certify this patient will need inpatient services for at least 2 midnights  Expected Medical Readiness: 12/07/2023          B Medical/Surgery History Past Medical History:  Diagnosis Date   Arthritis    Hypertension    Past Surgical History:  Procedure Laterality Date   ABDOMINAL HYSTERECTOMY  1987   FOOT SURGERY Bilateral 04/20/2000   HERNIA REPAIR     TOTAL HIP ARTHROPLASTY Bilateral 04/20/2006   WISDOM TOOTH EXTRACTION  1986   XI ROBOTIC ASSISTED VENTRAL HERNIA N/A 06/06/2021   Procedure: ROBOTIC INCISIONAL HERNIA REPAIR WITH MESH;  Surgeon: Lyndel Deward PARAS, MD;  Location: [facility];  Service: General;  Laterality: N/A;     A IV Location/Drains/Wounds Patient Lines/Drains/Airways Status     Active  Line/Drains/Airways     Name Placement date Placement time Site Days   Peripheral IV 12/05/23 20 G 1 Anterior;Distal;Left;Upper Arm 12/05/23  1028  Arm  less than 1   Incision - 4 Ports Abdomen 06/06/21  1534  -- 912            Intake/Output Last 24 hours  Intake/Output Summary (Last 24 hours) at 12/05/2023 1514 Last data filed at 12/05/2023 1458 Gross per 24 hour  Intake 1044.75 ml  Output --  Net 1044.75 ml    Labs/Imaging Results for orders placed or performed during the hospital encounter of 12/05/23 (from the past 48 hours)  CBC with Differential     Status: Abnormal   Collection Time: 12/05/23 10:19 AM  Result Value Ref Range   WBC 16.1 (H) 4.0 - 10.5 K/uL   RBC 5.32 (H) 3.87 - 5.11 MIL/uL   Hemoglobin 16.1 (H) 12.0 - 15.0 g/dL   HCT 53.3 (H) 63.9 - 53.9 %   MCV 87.6 80.0 - 100.0 fL   MCH 30.3 26.0 - 34.0 pg   MCHC 34.5 30.0 - 36.0 g/dL   RDW 85.5 88.4 - 84.4 %   Platelets 350 150 - 400 K/uL   nRBC 0.0 0.0 - 0.2 %   Neutrophils Relative % 67 %   Neutro Abs 10.6 (H) 1.7 - 7.7 K/uL   Lymphocytes Relative 26 %   Lymphs Abs 4.2 (H) 0.7 - 4.0 K/uL   Monocytes Relative 7 %   Monocytes Absolute 1.2 (H) 0.1 - 1.0 K/uL   Eosinophils Relative 0 %  Eosinophils Absolute 0.1 0.0 - 0.5 K/uL   Basophils Relative 0 %   Basophils Absolute 0.1 0.0 - 0.1 K/uL   Immature Granulocytes 0 %   Abs Immature Granulocytes 0.04 0.00 - 0.07 K/uL    Comment: Performed at [facility], [address] [place], [place] [address]  Comprehensive metabolic panel     Status: Abnormal   Collection Time: 12/05/23 10:19 AM  Result Value Ref Range   Sodium 141 135 - 145 mmol/L   Potassium 3.2 (L) 3.5 - 5.1 mmol/L   Chloride 101 98 - 111 mmol/L   CO2 23 22 - 32 mmol/L   Glucose, Bld 119 (H) 70 - 99 mg/dL    Comment: Glucose reference range applies only to samples taken after fasting for at least 8 hours.   BUN 12 8 - 23 mg/dL   Creatinine, Ser 9.17 0.44 - 1.00 mg/dL   Calcium  89.9 8.9 - 89.6 mg/dL   Total Protein 8.1 6.5 - 8.1 g/dL   Albumin 4.8 3.5 - 5.0 g/dL   AST 33 15 - 41 U/L   ALT 28 0 - 44 U/L   Alkaline Phosphatase 108 38 - 126 U/L   Total Bilirubin 1.1 0.0 - 1.2 mg/dL   GFR, Estimated >39 >39 mL/min    Comment: (NOTE) Calculated using the CKD-EPI Creatinine Equation (2021)    Anion gap 17 (H) 5 - 15    Comment: Performed at [facility], [address] [place], [place] [address]  Lipase, blood     Status: Abnormal   Collection Time: 12/05/23 10:19 AM  Result Value Ref Range   Lipase 60 (H) 11 - 51 U/L    Comment: Performed at [facility], [address] [place], [place] [address]  Urinalysis, Routine w reflex microscopic -Urine, Clean Catch     Status: Abnormal   Collection Time: 12/05/23 12:00 PM  Result Value Ref Range   Color, Urine YELLOW YELLOW   APPearance CLEAR CLEAR   Specific Gravity, Urine 1.015 1.005 - 1.030   pH 8.5 (H) 5.0 - 8.0   Glucose, UA NEGATIVE NEGATIVE mg/dL   Hgb urine dipstick TRACE (A) NEGATIVE   Bilirubin Urine NEGATIVE NEGATIVE   Ketones, ur 40 (A) NEGATIVE mg/dL   Protein, ur NEGATIVE NEGATIVE mg/dL   Nitrite NEGATIVE NEGATIVE   Leukocytes,Ua NEGATIVE NEGATIVE    Comment: Performed at [facility], [address] [place], [place] [address]  Urine Drug Screen     Status: Abnormal   Collection Time: 12/05/23 12:00 PM  Result Value Ref Range   Opiates DETECTED (A) NONE DETECTED   Cocaine NONE DETECTED NONE DETECTED   Benzodiazepines NONE DETECTED NONE DETECTED   Amphetamines NONE DETECTED NONE DETECTED   Tetrahydrocannabinol DETECTED (A) NONE DETECTED   Barbiturates NONE DETECTED NONE DETECTED   Methadone Scn, Ur NONE DETECTED NONE DETECTED   Fentanyl  NONE DETECTED NONE DETECTED    Comment: (NOTE) Drug Screen for Medical Purposes only. If confirmation is needed for any purpose, notify lab within 5 days. Drug Class                 Cutoff (ng/mL) Amphetamine and metabolites  1000 Barbiturate and metabolites 200 Benzodiazepine              200 Opiates and metabolites     300 Cocaine and metabolites     300 THC  50 Fentanyl                     5 Methadone                   300  Trazodone  is metabolized in vivo to several metabolites,  including pharmacologically active m-CPP, which is excreted in the  urine.  Immunoassay screens for amphetamines and MDMA have potential  cross-reactivity with these compounds and may provide false positive  result.  Performed at [facility], [address] [place], [place] [address]   Urinalysis, Microscopic (reflex)     Status: Abnormal   Collection Time: 12/05/23 12:00 PM  Result Value Ref Range   RBC / HPF 0-5 0 - 5 RBC/hpf   WBC, UA 0-5 0 - 5 WBC/hpf   Bacteria, UA MANY (A) NONE SEEN   Squamous Epithelial / HPF 0-5 0 - 5 /HPF    Comment: Performed at [facility], [address] [place], [place] [address]   CT ABDOMEN PELVIS W CONTRAST Result Date: 12/05/2023 CLINICAL DATA:  Mid abdominal pain and emesis since yesterday EXAM: CT ABDOMEN AND PELVIS WITH CONTRAST TECHNIQUE: Multidetector CT imaging of the abdomen and pelvis was performed using the standard protocol following bolus administration of intravenous contrast. RADIATION DOSE REDUCTION: This exam was performed according to the departmental dose-optimization program which includes automated exposure control, adjustment of the mA and/or kV according to patient size and/or use of iterative reconstruction technique. CONTRAST:  75mL OMNIPAQUE  IOHEXOL  300 MG/ML  SOLN COMPARISON:  04/24/2022 FINDINGS: Lower chest: No acute pleural or parenchymal lung disease. Hepatobiliary: Decreased liver attenuation compatible with hepatic steatosis. Gallbladder is unremarkable. No biliary duct dilation. Pancreas: There is mild peripancreatic fat stranding adjacent to the uncinate process, consistent with acute uncomplicated pancreatitis.  Remainder of the pancreas is unremarkable. No biliary duct dilation. Spleen: Stable splenic cyst or hemangioma. No evidence of splenomegaly. Adrenals/Urinary Tract: Simple appearing left renal cortical cysts do not require specific imaging follow-up. The right kidney is unremarkable. No urinary tract calculi or obstructive uropathy within either kidney. The adrenals appear normal. 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 Stomach/Bowel: No bowel obstruction or ileus. Normal appendix right lower quadrant. No bowel wall thickening or inflammatory change. Small hiatal hernia. Vascular/Lymphatic: Aortic atherosclerosis. No enlarged abdominal or pelvic lymph nodes. 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 Prior hysterectomy. No adnexal mass. Other: No free fluid or free intraperitoneal gas. No abdominal wall hernia. Musculoskeletal: Unremarkable bilateral hip arthroplasties. No acute or destructive bony abnormalities. IMPRESSION: 1. Peripancreatic fat stranding along the uncinate process, consistent with acute uncomplicated pancreatitis. 2. Mild hepatic steatosis. 3. Small hiatal hernia. 4.  Aortic Atherosclerosis (ICD10-I70.0). Electronically Signed   By: Ozell Daring M.D.   On: 12/05/2023 11:48    Pending Labs Unresulted Labs (From admission, onward)    None       Vitals/Pain Today's Vitals   12/05/23 1202 12/05/23 1230 12/05/23 1416 12/05/23 1421  BP:  (!) 155/89  (!) 162/102  Pulse:  70  67  Resp:    18  Temp:    98.2 F (36.8 C)  TempSrc:    Oral  SpO2:  96%  100%  Weight:      PainSc: 0-No pain  2      Isolation Precautions No active isolations  Medications Medications  ondansetron  (ZOFRAN ) injection  4 mg (4 mg Intravenous Given 12/05/23 1040)  famotidine  (PEPCID ) IVPB 20 mg premix (0 mg Intravenous Stopped 12/05/23 1114)  morphine  (PF) 4 MG/ML injection 4 mg (4  mg Intravenous Given 12/05/23 1041)  sodium chloride  0.9 % bolus 1,000 mL (0 mLs Intravenous Stopped 12/05/23 1458)  iohexol  (OMNIPAQUE ) 300 MG/ML solution 75 mL (75 mLs Intravenous Contrast Given 12/05/23 1123)  ondansetron  (ZOFRAN ) injection 4 mg (4 mg Intravenous Given 12/05/23 1323)    Mobility walks     Focused Assessments    R Recommendations: See Admitting Provider Note  Report given to:  Morna, RN  Additional Notes:

## 2023-12-05 NOTE — H&P (Signed)
 History and Physical  Kathryn Pruitt FMW:[id number] DOB: [birth year] DOA: 12/05/2023  PCP: Jason Leita Repine, FNP   Chief Complaint: abdominal pain, vomiting  HPI: Kathryn Pruitt is a 78 y.o. female with medical history significant for hypertension and acid reflux being admitted to the hospital with acute pancreatitis of unclear etiology.  Patient states she was in her usual state of health until last evening, after she ate some hot dogs and started to feel some vague abdominal discomfort.  After this, she had nausea and multiple episodes of nonbloody nonbilious vomiting.  States that her bowels been moving, and regular.  Has never had pancreatitis in the past.  She does not drink alcohol.  No new medications.  Review of Systems: Please see HPI for pertinent positives and negatives. A complete 10 system review of systems are otherwise negative.  Past Medical History:  Diagnosis Date   Arthritis    Hypertension    Past Surgical History:  Procedure Laterality Date   ABDOMINAL HYSTERECTOMY  1987   FOOT SURGERY Bilateral 04/20/2000   HERNIA REPAIR     TOTAL HIP ARTHROPLASTY Bilateral 04/20/2006   WISDOM TOOTH EXTRACTION  1986   XI ROBOTIC ASSISTED VENTRAL HERNIA N/A 06/06/2021   Procedure: ROBOTIC INCISIONAL HERNIA REPAIR WITH MESH;  Surgeon: Stechschulte, Deward PARAS, MD;  Location: [facility];  Service: General;  Laterality: N/A;   Social History:  reports that she quit smoking about 27 years ago. Her smoking use included cigarettes. She started smoking about 47 years ago. She has a 10 pack-year smoking history. She has never used smokeless tobacco. She reports that she does not currently use alcohol. She reports that she does not currently use drugs after having used the following drugs: Marijuana.  Allergies  Allergen Reactions   Prednisone Hives   Statins Other (See Comments)    Myalgia    Family History  Problem Relation Age of Onset   Lung cancer Mother    Hypertension Mother     Hypertension Brother    Bone cancer Maternal Grandmother      Prior to Admission medications   Medication Sig Start Date End Date Taking? Authorizing Provider  celecoxib  (CELEBREX ) 100 MG capsule Take 1 capsule (100 mg total) by mouth daily. 10/01/23   Jason Leita Repine, FNP  Cholecalciferol (VITAMIN D3 PO) Take 1 tablet by mouth daily.    [provider]  ezetimibe  (ZETIA ) 10 MG tablet Take 1 tablet (10 mg total) by mouth daily. 08/30/23   Jason Leita Repine, FNP  felodipine  (PLENDIL ) 10 MG 24 hr tablet TAKE 1 TABLET(10 MG) BY MOUTH DAILY 11/30/22   Jason Leita Repine, FNP  fluconazole  (DIFLUCAN ) 150 MG tablet Take 1 tablet daily as directed; repeat after 72 hours as directed 10/01/23   Jason Leita Repine, FNP  fosinopril  (MONOPRIL ) 20 MG tablet TAKE 1 TABLET(20 MG) BY MOUTH DAILY 03/02/23   Jason Leita Repine, FNP  gabapentin  (NEURONTIN ) 100 MG capsule Take 2 capsules (200 mg total) by mouth at bedtime as needed. 06/25/23   Jason Leita Repine, FNP  hydrochlorothiazide  (MICROZIDE ) 12.5 MG capsule Take 1 capsule (12.5 mg total) by mouth daily. 08/30/23   Jason Leita Repine, FNP  inclisiran (LEQVIO ) 284 MG/1.5ML SOSY injection Inject 284 mg into the skin once.    [provider]  loratadine  (CLARITIN ) 10 MG tablet Take 10 mg by mouth daily.    [provider]  ondansetron  (ZOFRAN ) 4 MG tablet Take 1 tablet (4 mg total) by mouth every 8 (eight)  hours as needed for nausea or vomiting. 06/25/23   Jason Leita Repine, FNP  pantoprazole  (PROTONIX ) 40 MG tablet TAKE 1 TABLET(40 MG) BY MOUTH DAILY 05/31/23   Jason Leita Repine, FNP  metoCLOPramide  (REGLAN ) 10 MG tablet Take 10 mg by mouth 2 (two) times daily. 06/22/17 12/01/19  [provider]    Physical Exam: BP (!) 164/87   Pulse 69   Temp 97.6 F (36.4 C) (Oral)   Resp 20   Wt 78 kg   SpO2 99%   BMI 28.62 kg/m  General:  Alert, oriented, calm, she looks nontoxic but is in some mild  distress from abdominal pain and nausea Eyes: EOMI, clear conjuctivae, white sclerea Neck: supple, no masses, trachea mildline  Cardiovascular: RRR, no murmurs or rubs, no peripheral edema  Respiratory: clear to auscultation bilaterally, no wheezes, no crackles  Abdomen: soft, nontender, nondistended, normal bowel tones heard  Skin: dry, no rashes  Musculoskeletal: no joint effusions, normal range of motion  Psychiatric: appropriate affect, normal speech  Neurologic: extraocular muscles intact, clear speech, moving all extremities with intact sensorium         Labs on Admission:  Basic Metabolic Panel: Recent Labs  Lab 12/05/23 1019  NA 141  K 3.2*  CL 101  CO2 23  GLUCOSE 119*  BUN 12  CREATININE 0.82  CALCIUM 10.0   Liver Function Tests: Recent Labs  Lab 12/05/23 1019  AST 33  ALT 28  ALKPHOS 108  BILITOT 1.1  PROT 8.1  ALBUMIN 4.8   Recent Labs  Lab 12/05/23 1019  LIPASE 60*   No results for input(s): AMMONIA in the last 168 hours. CBC: Recent Labs  Lab 12/05/23 1019  WBC 16.1*  NEUTROABS 10.6*  HGB 16.1*  HCT 46.6*  MCV 87.6  PLT 350   Cardiac Enzymes: No results for input(s): CKTOTAL, CKMB, CKMBINDEX, TROPONINI in the last 168 hours. BNP (last 3 results) No results for input(s): BNP in the last 8760 hours.  ProBNP (last 3 results) No results for input(s): PROBNP in the last 8760 hours.  CBG: No results for input(s): GLUCAP in the last 168 hours.  Radiological Exams on Admission: CT ABDOMEN PELVIS W CONTRAST Result Date: 12/05/2023 CLINICAL DATA:  Mid abdominal pain and emesis since yesterday EXAM: CT ABDOMEN AND PELVIS WITH CONTRAST TECHNIQUE: Multidetector CT imaging of the abdomen and pelvis was performed using the standard protocol following bolus administration of intravenous contrast. RADIATION DOSE REDUCTION: This exam was performed according to the departmental dose-optimization program which includes automated exposure  control, adjustment of the mA and/or kV according to patient size and/or use of iterative reconstruction technique. CONTRAST:  75mL OMNIPAQUE  IOHEXOL  300 MG/ML  SOLN COMPARISON:  04/24/2022 FINDINGS: Lower chest: No acute pleural or parenchymal lung disease. Hepatobiliary: Decreased liver attenuation compatible with hepatic steatosis. Gallbladder is unremarkable. No biliary duct dilation. Pancreas: There is mild peripancreatic fat stranding adjacent to the uncinate process, consistent with acute uncomplicated pancreatitis. Remainder of the pancreas is unremarkable. No biliary duct dilation. Spleen: Stable splenic cyst or hemangioma. No evidence of splenomegaly. Adrenals/Urinary Tract: Simple appearing left renal cortical cysts do not require specific imaging follow-up. The right kidney is unremarkable. No urinary tract calculi or obstructive uropathy within either kidney. The adrenals appear normal. 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 Stomach/Bowel: No bowel obstruction or ileus. Normal appendix right lower quadrant. No bowel wall thickening or inflammatory change. Small hiatal hernia. Vascular/Lymphatic: Aortic atherosclerosis. No  enlarged abdominal or pelvic lymph nodes. 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 Prior hysterectomy. No adnexal mass. Other: No free fluid or free intraperitoneal gas. No abdominal wall hernia. Musculoskeletal: Unremarkable bilateral hip arthroplasties. No acute or destructive bony abnormalities. IMPRESSION: 1. Peripancreatic fat stranding along the uncinate process, consistent with acute uncomplicated pancreatitis. 2. Mild hepatic steatosis. 3. Small hiatal hernia. 4.  Aortic Atherosclerosis (ICD10-I70.0). Electronically Signed   By: Ozell Daring M.D.   On: 12/05/2023 11:48   Assessment/Plan Kathryn Pruitt is a 78 y.o. female with medical history  significant for hypertension and acid reflux being admitted to the hospital with acute pancreatitis of unclear etiology.  Acute pancreatitis-unclear etiology, no new medications or alcohol intake.  No prior history of hypertriglyceridemia.  LFTs including bilirubin are completely normal, imaging without evidence of ductal dilatation or other complication. -Inpatient admission -N.p.o. except for ice chips and meds -IV fluids -Pain and nausea medication -Check triglyceride level  Hypertension-resume home medications, blood pressure initially uncontrolled likely due to pain and nausea  GERD-IV Protonix   DVT prophylaxis: Lovenox      Code Status: Full Code  Consults called: None  Admission status: The appropriate patient status for this patient is INPATIENT. Inpatient status is judged to be reasonable and necessary in order to provide the required intensity of service to ensure the patient's safety. The patient's presenting symptoms, physical exam findings, and initial radiographic and laboratory data in the context of their chronic comorbidities is felt to place them at high risk for further clinical deterioration. Furthermore, it is not anticipated that the patient will be medically stable for discharge from the hospital within 2 midnights of admission.    I certify that at the point of admission it is my clinical judgment that the patient will require inpatient hospital care spanning beyond 2 midnights from the point of admission due to high intensity of service, high risk for further deterioration and high frequency of surveillance required  Time spent: 56 minutes  Dorien Mayotte CHRISTELLA Gail MD [facility] Pager [phone]  If 7PM-7AM, please contact night-coverage [email or web address] Password [email or web address]  12/05/2023, 4:20 PM

## 2023-12-05 NOTE — ED Triage Notes (Signed)
 Reports mid abd pain, emesis since last night.  Denies constipation,diarrhea or urinary symptoms

## 2023-12-05 NOTE — ED Notes (Signed)
Po trial given

## 2023-12-06 ENCOUNTER — Inpatient Hospital Stay (HOSPITAL_COMMUNITY)

## 2023-12-06 DIAGNOSIS — K859 Acute pancreatitis without necrosis or infection, unspecified: Secondary | ICD-10-CM | POA: Diagnosis not present

## 2023-12-06 LAB — COMPREHENSIVE METABOLIC PANEL WITH GFR
ALT: 19 U/L (ref 0–44)
AST: 23 U/L (ref 15–41)
Albumin: 3.5 g/dL (ref 3.5–5.0)
Alkaline Phosphatase: 76 U/L (ref 38–126)
Anion gap: 10 (ref 5–15)
BUN: 11 mg/dL (ref 8–23)
CO2: 25 mmol/L (ref 22–32)
Calcium: 8.6 mg/dL — ABNORMAL LOW (ref 8.9–10.3)
Chloride: 102 mmol/L (ref 98–111)
Creatinine, Ser: 0.69 mg/dL (ref 0.44–1.00)
GFR, Estimated: >60 mL/min (ref >60)
Glucose, Bld: 127 mg/dL — ABNORMAL HIGH (ref 70–99)
Potassium: 3 mmol/L — ABNORMAL LOW (ref 3.5–5.1)
Sodium: 137 mmol/L (ref 135–145)
Total Bilirubin: 1.6 mg/dL — ABNORMAL HIGH (ref 0.0–1.2)
Total Protein: 6.9 g/dL (ref 6.5–8.1)

## 2023-12-06 LAB — CBC
HCT: 41.5 % (ref 36.0–46.0)
Hemoglobin: 13.7 g/dL (ref 12.0–15.0)
MCH: 30.4 pg (ref 26.0–34.0)
MCHC: 33 g/dL (ref 30.0–36.0)
MCV: 92 fL (ref 80.0–100.0)
Platelets: 297 K/uL (ref 150–400)
RBC: 4.51 MIL/uL (ref 3.87–5.11)
RDW: 14.6 % (ref 11.5–15.5)
WBC: 13.2 K/uL — ABNORMAL HIGH (ref 4.0–10.5)
nRBC: 0 % (ref 0.0–0.2)

## 2023-12-06 MED ORDER — LISINOPRIL 20 MG PO TABS
20.0000 mg | ORAL_TABLET | Freq: Every day | ORAL | Status: DC
  Administered 2023-12-06 – 2023-12-08 (×3): 20 mg via ORAL
  Filled 2023-12-06 (×3): qty 1

## 2023-12-06 MED ORDER — FELODIPINE ER 2.5 MG PO TB24
10.0000 mg | ORAL_TABLET | Freq: Every day | ORAL | Status: DC
  Administered 2023-12-06 – 2023-12-08 (×3): 10 mg via ORAL
  Filled 2023-12-06 (×3): qty 4

## 2023-12-06 MED ORDER — PANTOPRAZOLE SODIUM 40 MG PO TBEC
40.0000 mg | DELAYED_RELEASE_TABLET | Freq: Every day | ORAL | Status: DC
  Administered 2023-12-06 – 2023-12-08 (×3): 40 mg via ORAL
  Filled 2023-12-06 (×3): qty 1

## 2023-12-06 MED ORDER — POTASSIUM CHLORIDE CRYS ER 20 MEQ PO TBCR
40.0000 meq | EXTENDED_RELEASE_TABLET | ORAL | Status: AC
  Administered 2023-12-06: 40 meq via ORAL
  Filled 2023-12-06 (×2): qty 2

## 2023-12-06 MED ORDER — LORATADINE 10 MG PO TABS
10.0000 mg | ORAL_TABLET | Freq: Every day | ORAL | Status: DC
Start: 2023-12-06 — End: 2023-12-08
  Administered 2023-12-06 – 2023-12-08 (×3): 10 mg via ORAL
  Filled 2023-12-06 (×4): qty 1

## 2023-12-06 MED ORDER — LACTATED RINGERS IV SOLN: INTRAVENOUS | Status: AC

## 2023-12-06 MED ORDER — POTASSIUM CHLORIDE 10 MEQ/100ML IV SOLN
10.0000 meq | INTRAVENOUS | Status: AC
  Administered 2023-12-06 (×4): 10 meq via INTRAVENOUS
  Filled 2023-12-06 (×4): qty 100

## 2023-12-06 MED ORDER — CELECOXIB 100 MG PO CAPS
100.0000 mg | ORAL_CAPSULE | Freq: Every day | ORAL | Status: DC
Start: 2023-12-06 — End: 2023-12-08
  Administered 2023-12-06 – 2023-12-08 (×3): 100 mg via ORAL
  Filled 2023-12-06 (×3): qty 1

## 2023-12-06 NOTE — Progress Notes (Signed)
   12/06/23 1015  TOC Brief Assessment  Insurance and Status Reviewed  Patient has primary care physician Yes  Home environment has been reviewed single family home  Prior level of function: independent  Prior/Current Home Services No current home services  Social Drivers of Health Review SDOH reviewed no interventions necessary  Readmission risk has been reviewed Yes  Transition of care needs no transition of care needs at this time    Signed: Heather Saltness, MSW, LCSW Clinical Social Worker Inpatient Care Management 12/06/2023 10:15 AM

## 2023-12-06 NOTE — Plan of Care (Signed)

## 2023-12-06 NOTE — Plan of Care (Signed)

## 2023-12-06 NOTE — Progress Notes (Signed)
 [facility] Progress Note  Patient: Kathryn Pruitt     FMW:[id number]  DOA: 12/05/2023   PCP: Jason Leita Repine, FNP       Brief hospital course: HPI: Kathryn Pruitt is a 78 y.o. female with medical history significant for hypertension and acid reflux being admitted to the hospital with acute pancreatitis of unclear etiology.  Patient states she was in her usual state of health until last evening, after she ate some hot dogs and started to feel some vague abdominal discomfort.  After this, she had nausea and multiple episodes of nonbloody nonbilious vomiting.  States that her bowels been moving, and regular.  Has never had pancreatitis in the past.  She does not drink alcohol.  No new medications. CT abdomen pelvis: eripancreatic fat stranding along the uncinate process, consistent with acute uncomplicated pancreatitis.  Subjective:  Abdominal pain and nausea improved.  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 - Discussed complication and long-term side effects of marijuana in detail    Code Status: Full Code Total time on patient care: 40 minutes DVT prophylaxis:  enoxaparin  (LOVENOX ) injection 40 mg Start: 12/05/23 2200     Objective:   Vitals:   12/05/23 1939 12/05/23 2346 12/06/23 0345 12/06/23 1527  BP: (!) 153/89 (!) 175/86 (!) 154/92 136/76  Pulse: 74 70 80 82  Resp: 20 20 18 18   Temp: 99.1 F (37.3 C) 98.2 F (36.8 C) 98.5 F (36.9 C)  98.1 F (36.7 C)  TempSrc: Oral     SpO2: 98% 100% 97% 97%  Weight:      Height:       Filed Weights   12/05/23 1018  Weight: 78 kg   Exam: General exam: Appears comfortable  HEENT: oral mucosa moist Respiratory system: Clear to auscultation.  Cardiovascular system: S1 & S2 heard  Gastrointestinal system: Abdomen soft, tender in epigastrium, nondistended. Normal bowel sounds   Extremities: No cyanosis, clubbing or edema Psychiatry:  Mood & affect appropriate.    CBC: Recent Labs  Lab 12/05/23 1019 12/06/23 0502  WBC 16.1* 13.2*  NEUTROABS 10.6*  --   HGB 16.1* 13.7  HCT 46.6* 41.5  MCV 87.6 92.0  PLT 350 297   Basic Metabolic Panel: Recent Labs  Lab 12/05/23 1019 12/05/23 1642 12/06/23 0502  NA 141  --  137  K 3.2*  --  3.0*  CL 101  --  102  CO2 23  --  25  GLUCOSE 119*  --  127*  BUN 12  --  11  CREATININE 0.82  --  0.69  CALCIUM 10.0  --  8.6*  MG  --  2.0  --      Scheduled Meds:  celecoxib   100 mg Oral Daily   enoxaparin  (LOVENOX ) injection  40 mg Subcutaneous QHS   felodipine   10 mg Oral Daily   lisinopril   20 mg Oral Daily   loratadine   10 mg Oral Daily   pantoprazole   40 mg Oral Daily   potassium chloride   40 mEq Oral Q4H    Imaging and lab data personally reviewed   Author: Rayven Hendrickson  12/06/2023 6:54 PM  To contact [facility]>   Check the care team in [facility] and look for the attending/consulting [facility] provider listed  Log into [email or web address] and use [facility] universal password   Go to> [facility]  and find provider  If you still have difficulty reaching the provider, please page the [facility] (Director on Call) for the Hospitalists listed on amion

## 2023-12-07 DIAGNOSIS — K8500 Idiopathic acute pancreatitis without necrosis or infection: Secondary | ICD-10-CM | POA: Diagnosis not present

## 2023-12-07 LAB — BASIC METABOLIC PANEL WITH GFR
Anion gap: 11 (ref 5–15)
BUN: 8 mg/dL (ref 8–23)
CO2: 25 mmol/L (ref 22–32)
Calcium: 9.2 mg/dL (ref 8.9–10.3)
Chloride: 102 mmol/L (ref 98–111)
Creatinine, Ser: 0.66 mg/dL (ref 0.44–1.00)
GFR, Estimated: >60 mL/min (ref >60)
Glucose, Bld: 105 mg/dL — ABNORMAL HIGH (ref 70–99)
Potassium: 3 mmol/L — ABNORMAL LOW (ref 3.5–5.1)
Sodium: 138 mmol/L (ref 135–145)

## 2023-12-07 LAB — CBC
HCT: 42.6 % (ref 36.0–46.0)
Hemoglobin: 14.4 g/dL (ref 12.0–15.0)
MCH: 30.4 pg (ref 26.0–34.0)
MCHC: 33.8 g/dL (ref 30.0–36.0)
MCV: 89.9 fL (ref 80.0–100.0)
Platelets: 290 K/uL (ref 150–400)
RBC: 4.74 MIL/uL (ref 3.87–5.11)
RDW: 14.4 % (ref 11.5–15.5)
WBC: 11.2 K/uL — ABNORMAL HIGH (ref 4.0–10.5)
nRBC: 0 % (ref 0.0–0.2)

## 2023-12-07 MED ORDER — POTASSIUM CHLORIDE 10 MEQ/100ML IV SOLN
10.0000 meq | INTRAVENOUS | Status: AC
  Administered 2023-12-07 (×4): 10 meq via INTRAVENOUS
  Filled 2023-12-07 (×4): qty 100

## 2023-12-07 MED ORDER — SENNOSIDES-DOCUSATE SODIUM 8.6-50 MG PO TABS: 2.0000 | ORAL_TABLET | Freq: Every evening | ORAL | Status: DC | PRN

## 2023-12-07 MED ORDER — BISACODYL 10 MG RE SUPP: 10.0000 mg | Freq: Every day | RECTAL | Status: DC | PRN

## 2023-12-07 NOTE — Plan of Care (Signed)
   Problem: Clinical Measurements: Goal: Will remain free from infection Outcome: Progressing Goal: Diagnostic test results will improve Outcome: Progressing   Problem: Nutrition: Goal: Adequate nutrition will be maintained Outcome: Progressing

## 2023-12-07 NOTE — Plan of Care (Signed)

## 2023-12-07 NOTE — Progress Notes (Signed)
 [facility] Progress Note  Patient: Kathryn Pruitt     FMW:[id number]  DOA: 12/05/2023   PCP: Kathryn Leita Repine, FNP       Brief hospital course: HPI: Kathryn Pruitt is a 78 y.o. female with medical history significant for hypertension and acid reflux being admitted to the hospital with acute pancreatitis of unclear etiology.  Patient states she was in her usual state of health until last evening, after she ate some hot dogs and started to feel some vague abdominal discomfort.  After this, she had nausea and multiple episodes of nonbloody nonbilious vomiting.  States that her bowels been moving, and regular.  Has never had pancreatitis in the past.  She does not drink alcohol.  No new medications. CT abdomen pelvis: eripancreatic fat stranding along the uncinate process, consistent with acute uncomplicated pancreatitis.  Subjective:  Abdominal pain and nausea improved but pt still not feeling up to taking po meds or food today  Assessment and Plan:    Acute pancreatitis -Symptoms improving-start clear liquids as tolerated but pt has not taken much in today - Likely secondary to HCTZ-abdominal ultrasound and CT do not suggest any gallstones-triglycerides normal - Continue IV fluids-LR at 75 cc an hour  Active Problems:   HTN (hypertension) - Holding HCTZ - Continue ACE inhibitor and felodipine   Fatty liver - Discussed with patient-she is working on weight loss and lowering her lipids  Hiatal hernia - Noted on imaging  Hyperlipidemia -Leqvio  (inclisiran)    THC positive UDS - She takes delta 9 Gummies - pt has been educated on complication and long-term side effects of marijuana in detail    Code Status: Full Code Total time on patient care: 40 minutes DVT prophylaxis:  enoxaparin  (LOVENOX ) injection 40 mg Start: 12/05/23 2200     Objective:   Vitals:   12/06/23 2021 12/07/23 0522 12/07/23 1001 12/07/23 1153  BP: (!) 159/95 (!) 159/91 (!) 159/91 (!)  175/99  Pulse: 75 72  96  Resp: 16 16  16   Temp: 98.3 F (36.8 C) 98.3 F (36.8 C)  98.7 F (37.1 C)  TempSrc:      SpO2: 100% 100%  100%  Weight:      Height:       Filed Weights   12/05/23 1018  Weight: 78 kg   Exam: General exam: Appears comfortable  HEENT: oral mucosa moist Respiratory system: Clear to auscultation.  Cardiovascular system: S1 & S2 heard  Gastrointestinal system: Abdomen soft, tender in epigastrium, nondistended. Normal bowel sounds   Extremities: No cyanosis, clubbing or edema Psychiatry:  Mood & affect appropriate.    CBC: Recent Labs  Lab 12/05/23 1019 12/06/23 0502 12/07/23 0553  WBC 16.1* 13.2* 11.2*  NEUTROABS 10.6*  --   --   HGB 16.1* 13.7 14.4  HCT 46.6* 41.5 42.6  MCV 87.6 92.0 89.9  PLT 350 297 290   Basic Metabolic Panel: Recent Labs  Lab 12/05/23 1019 12/05/23 1642 12/06/23 0502 12/07/23 0553  NA 141  --  137 138  K 3.2*  --  3.0* 3.0*  CL 101  --  102 102  CO2 23  --  25 25  GLUCOSE 119*  --  127* 105*  BUN 12  --  11 8  CREATININE 0.82  --  0.69 0.66  CALCIUM 10.0  --  8.6* 9.2  MG  --  2.0  --   --      Scheduled Meds:  celecoxib   100 mg Oral  Daily   enoxaparin  (LOVENOX ) injection  40 mg Subcutaneous QHS   felodipine   10 mg Oral Daily   lisinopril   20 mg Oral Daily   loratadine   10 mg Oral Daily   pantoprazole   40 mg Oral Daily    Imaging and lab data personally reviewed   Author: Shogo Larkey  12/07/2023 4:19 PM  To contact [facility]>   Check the care team in [facility] and look for the attending/consulting [facility] provider listed  Log into [email or web address] and use [place]'s universal password   Go to> [facility]  and find provider  If you still have difficulty reaching the provider, please page the [facility] (Director on Call) for the Hospitalists listed on amion

## 2023-12-08 DIAGNOSIS — K8500 Idiopathic acute pancreatitis without necrosis or infection: Secondary | ICD-10-CM | POA: Diagnosis not present

## 2023-12-08 LAB — BASIC METABOLIC PANEL WITH GFR
Anion gap: 11 (ref 5–15)
BUN: 14 mg/dL (ref 8–23)
CO2: 24 mmol/L (ref 22–32)
Calcium: 9.6 mg/dL (ref 8.9–10.3)
Chloride: 104 mmol/L (ref 98–111)
Creatinine, Ser: 0.85 mg/dL (ref 0.44–1.00)
GFR, Estimated: >60 mL/min (ref >60)
Glucose, Bld: 93 mg/dL (ref 70–99)
Potassium: 3.4 mmol/L — ABNORMAL LOW (ref 3.5–5.1)
Sodium: 139 mmol/L (ref 135–145)

## 2023-12-08 LAB — MAGNESIUM: Magnesium: 2.1 mg/dL (ref 1.7–2.4)

## 2023-12-08 NOTE — Discharge Summary (Signed)
 Physician Discharge Summary  Kathryn Pruitt FMW:[id number] DOB: 1945/05/05 DOA: 12/05/2023  PCP: Jason Leita Repine, FNP  Admit date: 12/05/2023 Discharge date: 12/08/2023  Admitted From: Home  Discharge disposition: Home   Recommendations for Outpatient Follow-Up:   Follow up with your primary care provider in one week.  Check CBC,CMP magnesium in the next visit Might need to adjust antihypertensive medications as outpatient.  HCTZ was discontinued in the context of pancreatitis.   Discharge Diagnosis:   Principal Problem:   Acute pancreatitis Active Problems:   HTN (hypertension)   Discharge Condition: Improved.  Diet recommendation: Low sodium, heart healthy.  Low fat diet  Wound care: None.  Code status: Full.   History of Present Illness:   Kathryn Pruitt is a 78 y.o. female with past medical history significant for hypertension and GERD presented to hospital with abdominal discomfort nausea and multiple episodes of vomiting after eating some hot dogs.  No history of alcohol ingestion.  In the ED initial blood pressure was slightly elevated.  Labs were notable for leukocytosis with WBC at 16.1.  Hemoglobin at 16.1.  Lipase 60.  BMP was notable for potassium of 3.2.  Urinalysis was negative for infection.  Urine drug screen was positive for opiates and THC.  CT scan of the abdomen and pelvis showed peripancreatic fat stranding along the uncinate process consistent with acute uncomplicated pancreatitis.  Right upper quadrant ultrasound showed hepatic steatosis.  Patient denied any history of pancreatitis in the past.  Patient was then admitted to the hospital for further evaluation and treatment.  Hospital Course:   Following conditions were addressed during hospitalization as listed below,  Acute pancreatitis Triglyceride normal.  Received IV fluids.  No evidence of gallstones.  Could be secondary to HCTZ.  Patient was advanced on oral diet which she has tolerated  at this time.  Advised follow-up with PCP as outpatient.  Advised against fatty fried foods.  Essential hypertension. Holding HCTZ, continue Continue ACE inhibitor and felodipine .  PCP to adjust her medications as outpatient.   Fatty liver Would benefit from weight loss and lifestyle modification.    Hyperlipidemia On Zetia  and inclisiran   THC positive UDS - She takes delta 9 Gummies, Counseling done.  Disposition.  At this time, patient is stable for disposition home with outpatient PCP follow-up  Medical Consultants:   None.  Procedures:    None Subjective:   Today, patient was seen and examined at bedside.  States that he feels a little nauseated but has been tolerating oral diet including soft diet.  Denies any abdominal pain.  Good bowel movements.  Discharge Exam:   Vitals:   12/08/23 0952 12/08/23 1150  BP: 129/86 (!) 133/96  Pulse:  77  Resp:    Temp:  98.3 F (36.8 C)  SpO2:  99%   Vitals:   12/07/23 2012 12/08/23 0343 12/08/23 0952 12/08/23 1150  BP: 137/82 129/86 129/86 (!) 133/96  Pulse: 74 69  77  Resp: 17 18    Temp: 98.1 F (36.7 C) 97.6 F (36.4 C)  98.3 F (36.8 C)  TempSrc:  Oral  Oral  SpO2: 100% 97%  99%  Weight:      Height:       Body mass index is 27.76 kg/m.   General: Alert awake, not in obvious distress HENT: pupils equally reacting to light,  No scleral pallor or icterus noted. Oral mucosa is moist.  Chest:  Clear breath sounds.  . No crackles or wheezes.  CVS: S1 &S2 heard. No murmur.  Regular rate and rhythm. Abdomen: Soft, nontender, nondistended.  Bowel sounds are heard.   Extremities: No cyanosis, clubbing or edema.  Peripheral pulses are palpable. Psych: Alert, awake and oriented, normal mood CNS:  No cranial nerve deficits.  Power equal in all extremities.   Skin: Warm and dry.  No rashes noted.  The results of significant diagnostics from this hospitalization (including imaging, microbiology, ancillary and  laboratory) are listed below for reference.     Diagnostic Studies:   US  Abdomen Limited RUQ (LIVER/GB) Result Date: 12/06/2023 CLINICAL DATA:  6216 Acute pancreatitis 6216 EXAM: ULTRASOUND ABDOMEN LIMITED RIGHT UPPER QUADRANT COMPARISON:  December 05, 2023 FINDINGS: Gallbladder: No gallstones. No wall thickening or pericholecystic fluid. No sonographic Murphy's sign noted by sonographer. Common bile duct: Diameter: 3 mm Liver: Increased echogenicity. No focal lesion identified. No intrahepatic biliary ductal dilation. Portal vein is patent on color Doppler imaging with normal direction of blood flow towards the liver. Right Kidney: Partially visualized. No mass. No hydronephrosis or nephrolithiasis. Other: None. IMPRESSION: 1. No cholecystolithiasis or changes of acute cholecystitis. 2. Hepatic steatosis. Electronically Signed   By: Rogelia Myers M.D.   On: 12/06/2023 10:37   CT ABDOMEN PELVIS W CONTRAST Result Date: 12/05/2023 CLINICAL DATA:  Mid abdominal pain and emesis since yesterday EXAM: CT ABDOMEN AND PELVIS WITH CONTRAST TECHNIQUE: Multidetector CT imaging of the abdomen and pelvis was performed using the standard protocol following bolus administration of intravenous contrast. RADIATION DOSE REDUCTION: This exam was performed according to the departmental dose-optimization program which includes automated exposure control, adjustment of the mA and/or kV according to patient size and/or use of iterative reconstruction technique. CONTRAST:  75mL OMNIPAQUE  IOHEXOL  300 MG/ML  SOLN COMPARISON:  04/24/2022 FINDINGS: Lower chest: No acute pleural or parenchymal lung disease. Hepatobiliary: Decreased liver attenuation compatible with hepatic steatosis. Gallbladder is unremarkable. No biliary duct dilation. Pancreas: There is mild peripancreatic fat stranding adjacent to the uncinate process, consistent with acute uncomplicated pancreatitis. Remainder of the pancreas is unremarkable. No biliary duct  dilation. Spleen: Stable splenic cyst or hemangioma. No evidence of splenomegaly. Adrenals/Urinary Tract: Simple appearing left renal cortical cysts do not require specific imaging follow-up. The right kidney is unremarkable. No urinary tract calculi or obstructive uropathy within either kidney. The adrenals appear normal. 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 Stomach/Bowel: No bowel obstruction or ileus. Normal appendix right lower quadrant. No bowel wall thickening or inflammatory change. Small hiatal hernia. Vascular/Lymphatic: Aortic atherosclerosis. No enlarged abdominal or pelvic lymph nodes. 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 Prior hysterectomy. No adnexal mass. Other: No free fluid or free intraperitoneal gas. No abdominal wall hernia. Musculoskeletal: Unremarkable bilateral hip arthroplasties. No acute or destructive bony abnormalities. IMPRESSION: 1. Peripancreatic fat stranding along the uncinate process, consistent with acute uncomplicated pancreatitis. 2. Mild hepatic steatosis. 3. Small hiatal hernia. 4.  Aortic Atherosclerosis (ICD10-I70.0). Electronically Signed   By: Ozell Daring M.D.   On: 12/05/2023 11:48     Labs:   Basic Metabolic Panel: Recent Labs  Lab 12/05/23 1019 12/05/23 1642 12/06/23 0502 12/07/23 0553 12/08/23 1033  NA 141  --  137 138 139  K 3.2*  --  3.0* 3.0* 3.4*  CL 101  --  102 102 104  CO2 23  --  25 25 24   GLUCOSE 119*  --  127* 105* 93  BUN 12  --  11 8 14   CREATININE 0.82  --  0.69 0.66 0.85  CALCIUM 10.0  --  8.6* 9.2 9.6  MG  --  2.0  --   --  2.1   GFR Estimated Creatinine Clearance: 59.4 mL/min (by C-G formula based on SCr of 0.85 mg/dL). Liver Function Tests: Recent Labs  Lab 12/05/23 1019 12/06/23 0502  AST 33 23  ALT 28 19  ALKPHOS 108 76  BILITOT 1.1 1.6*  PROT 8.1 6.9  ALBUMIN 4.8 3.5   Recent Labs   Lab 12/05/23 1019  LIPASE 60*   No results for input(s): AMMONIA in the last 168 hours. Coagulation profile No results for input(s): INR, PROTIME in the last 168 hours.  CBC: Recent Labs  Lab 12/05/23 1019 12/06/23 0502 12/07/23 0553  WBC 16.1* 13.2* 11.2*  NEUTROABS 10.6*  --   --   HGB 16.1* 13.7 14.4  HCT 46.6* 41.5 42.6  MCV 87.6 92.0 89.9  PLT 350 297 290   Cardiac Enzymes: No results for input(s): CKTOTAL, CKMB, CKMBINDEX, TROPONINI in the last 168 hours. BNP: Invalid input(s): POCBNP CBG: No results for input(s): GLUCAP in the last 168 hours. D-Dimer No results for input(s): DDIMER in the last 72 hours. Hgb A1c No results for input(s): HGBA1C in the last 72 hours. Lipid Profile No results for input(s): CHOL, HDL, LDLCALC, TRIG, CHOLHDL, LDLDIRECT in the last 72 hours.  Thyroid  function studies No results for input(s): TSH, T4TOTAL, T3FREE, THYROIDAB in the last 72 hours.  Invalid input(s): FREET3 Anemia work up No results for input(s): VITAMINB12, FOLATE, FERRITIN, TIBC, IRON, RETICCTPCT in the last 72 hours. Microbiology No results found for this or any previous visit (from the past 240 hours).   Discharge Instructions:   Discharge Instructions     Call MD for:  persistant nausea and vomiting   Complete by: As directed    Call MD for:  severe uncontrolled pain   Complete by: As directed    Call MD for:  temperature >100.4   Complete by: As directed    Diet - low sodium heart healthy   Complete by: As directed    Discharge instructions   Complete by: As directed    Follow-up with your primary care provider in 1 week.  Check blood work at that time.  Avoid fatty, fried greasy cheesy food if possible.  No alcohol.  You have been discontinued from HCTZ for now.   Increase activity slowly   Complete by: As directed       Allergies as of 12/08/2023       Reactions   Prednisone Hives    Statins Other (See Comments)   Myalgia        Medication List     STOP taking these medications    hydrochlorothiazide  12.5 MG capsule Commonly known as: MICROZIDE        TAKE these medications    amoxicillin  500 MG capsule Commonly known as: AMOXIL  TAKE 4 CAPSULES BY MOUTH 1 HOURS BEFORE DENTAL WORK   celecoxib  100 MG capsule Commonly known as: CELEBREX  Take 1 capsule (100 mg total) by mouth daily.   ezetimibe  10 MG tablet Commonly known as: ZETIA  Take 1 tablet (10 mg total) by mouth daily.   felodipine  10 MG 24 hr tablet Commonly known as: PLENDIL  TAKE 1 TABLET(10 MG) BY MOUTH DAILY   fosinopril  20 MG tablet Commonly known as: MONOPRIL  TAKE 1 TABLET(20 MG) BY MOUTH DAILY   gabapentin  100 MG capsule Commonly known  as: NEURONTIN  Take 2 capsules (200 mg total) by mouth at bedtime as needed.   Leqvio  284 MG/1.5ML Sosy injection Generic drug: inclisiran Inject 284 mg into the skin every 6 (six) months.   loratadine  10 MG tablet Commonly known as: CLARITIN  Take 10 mg by mouth daily.   ondansetron  4 MG tablet Commonly known as: Zofran  Take 1 tablet (4 mg total) by mouth every 8 (eight) hours as needed for nausea or vomiting.   pantoprazole  40 MG tablet Commonly known as: PROTONIX  TAKE 1 TABLET(40 MG) BY MOUTH DAILY   PreviDent 5000 Sensitive 1.1-5 % Gel Generic drug: Sod Fluoride-Potassium Nitrate Apply 1 Application topically daily.   VITAMIN D3 PO Take 1 tablet by mouth daily.        Follow-up Information     Jason Leita Repine, FNP Follow up in 1 week(s).   Specialty: Internal Medicine Contact information: [address] [place] [address] [phone]                  Time coordinating discharge: 39 minutes  Signed:  Kadarrius Yanke  [facility] 12/08/2023, 5:13 PM

## 2023-12-08 NOTE — Plan of Care (Signed)

## 2023-12-09 ENCOUNTER — Telehealth: Payer: Self-pay

## 2023-12-09 NOTE — Transitions of Care (Post Inpatient/ED Visit) (Signed)
 12/09/2023  Name: Kathryn Pruitt MRN: [id number] DOB: 10-Apr-1946  Today's TOC FU Call Status: Today's TOC FU Call Status:: Successful TOC FU Call Completed TOC FU Call Complete Date: 12/09/23 Patient's Name and Date of Birth confirmed.  Transition Care Management Follow-up Telephone Call Date of Discharge: 12/08/23 Discharge Facility: [facility]) Type of Discharge: Inpatient Admission Primary Inpatient Discharge Diagnosis:: pancreatitis How have you been since you were released from the hospital?: Better Any questions or concerns?: No  Items Reviewed: Did you receive and understand the discharge instructions provided?: Yes Medications obtained,verified, and reconciled?: Yes (Medications Reviewed) Any new allergies since your discharge?: No Dietary orders reviewed?: Yes Do you have support at home?: Yes People in Home [RPT]: spouse  Medications Reviewed Today: Medications Reviewed Today     Reviewed by Emmitt Pan, LPN (Licensed Practical Nurse) on 12/09/23 at 1049  Med List Status: <None>   Medication Order Taking? Sig Documenting Provider Last Dose Status Informant  amoxicillin  (AMOXIL ) 500 MG capsule [id number] Yes TAKE 4 CAPSULES BY MOUTH 1 HOURS BEFORE DENTAL WORK [provider]  Active Self  celecoxib  (CELEBREX ) 100 MG capsule [id number] Yes Take 1 capsule (100 mg total) by mouth daily. Jason Leita Repine, FNP  Active Self  Cholecalciferol (VITAMIN D3 PO) [id number] Yes Take 1 tablet by mouth daily. [provider]  Active Self  ezetimibe  (ZETIA ) 10 MG tablet [id number] Yes Take 1 tablet (10 mg total) by mouth daily. Jason Leita Repine, FNP  Active Self  felodipine  (PLENDIL ) 10 MG 24 hr tablet [id number] Yes TAKE 1 TABLET(10 MG) BY MOUTH DAILY Jason Leita Repine, FNP  Active Self  fosinopril  (MONOPRIL ) 20 MG tablet [id number] Yes TAKE 1 TABLET(20 MG) BY MOUTH DAILY Jason Leita Repine, FNP  Active Self  gabapentin  (NEURONTIN ) 100 MG  capsule [id number] Yes Take 2 capsules (200 mg total) by mouth at bedtime as needed. Jason Leita Repine, FNP  Active Self  inclisiran (LEQVIO ) 284 MG/1.5ML SOSY injection [id number] Yes Inject 284 mg into the skin every 6 (six) months. [provider]  Active Self           Med Note ALLEGRA, DEATRICE LILLETTE Repress Dec 05, 2023 10:42 PM) Last dose: June 2025   loratadine  (CLARITIN ) 10 MG tablet [id number] Yes Take 10 mg by mouth daily. [provider]  Active Self    Discontinued 12/01/19 0205 ondansetron  (ZOFRAN ) 4 MG tablet [id number] Yes Take 1 tablet (4 mg total) by mouth every 8 (eight) hours as needed for nausea or vomiting. Jason Leita Repine, FNP  Active Self  pantoprazole  (PROTONIX ) 40 MG tablet [id number] Yes TAKE 1 TABLET(40 MG) BY MOUTH DAILY Jason Leita Repine, FNP  Active Self  PREVIDENT 5000 SENSITIVE 1.1-5 % GEL [id number] Yes Apply 1 Application topically daily. [provider]  Active Self            Home Care and Equipment/Supplies: Were Home Health Services Ordered?: NA Any new equipment or medical supplies ordered?: NA  Functional Questionnaire: Do you need assistance with bathing/showering or dressing?: No Do you need assistance with meal preparation?: No Do you need assistance with eating?: No Do you have difficulty maintaining continence: No Do you need assistance with getting out of bed/getting out of a chair/moving?: No Do you have difficulty managing or taking your medications?: No  Follow up appointments reviewed: PCP Follow-up appointment confirmed?: Yes Date of PCP follow-up appointment?: 12/16/23 Follow-up Provider: [facility] Follow-up appointment confirmed?: NA Do you need transportation to  your follow-up appointment?: No Do you understand care options if your condition(s) worsen?: Yes-patient verbalized understanding    SIGNATURE Julian Lemmings, LPN [facility] Nurse Health Advisor Direct Dial  [phone]

## 2023-12-13 ENCOUNTER — Other Ambulatory Visit: Payer: Self-pay | Admitting: Family

## 2023-12-13 ENCOUNTER — Other Ambulatory Visit (HOSPITAL_BASED_OUTPATIENT_CLINIC_OR_DEPARTMENT_OTHER): Payer: Self-pay

## 2023-12-16 ENCOUNTER — Encounter: Payer: Self-pay | Admitting: Family

## 2023-12-16 ENCOUNTER — Other Ambulatory Visit (HOSPITAL_BASED_OUTPATIENT_CLINIC_OR_DEPARTMENT_OTHER): Payer: Self-pay

## 2023-12-16 ENCOUNTER — Encounter: Payer: Self-pay | Admitting: Internal Medicine

## 2023-12-16 ENCOUNTER — Ambulatory Visit (INDEPENDENT_AMBULATORY_CARE_PROVIDER_SITE_OTHER): Admitting: Family

## 2023-12-16 VITALS — BP 132/84 | HR 65 | Ht 66.0 in | Wt 171.8 lb

## 2023-12-16 DIAGNOSIS — R899 Unspecified abnormal finding in specimens from other organs, systems and tissues: Secondary | ICD-10-CM

## 2023-12-16 DIAGNOSIS — E785 Hyperlipidemia, unspecified: Secondary | ICD-10-CM | POA: Diagnosis not present

## 2023-12-16 DIAGNOSIS — K859 Acute pancreatitis without necrosis or infection, unspecified: Secondary | ICD-10-CM

## 2023-12-16 MED ORDER — CELECOXIB 100 MG PO CAPS
100.0000 mg | ORAL_CAPSULE | Freq: Every day | ORAL | 1 refills | Status: DC
  Filled 2023-12-16: qty 30, 30d supply, fill #0
  Filled 2024-02-29: qty 30, 30d supply, fill #1

## 2023-12-16 NOTE — Progress Notes (Signed)
 Kathryn Pruitt is a 78 y.o. 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   Osteoarthrosis, pelvic region and thigh 08/06/2021   Incisional hernia 06/06/2021   Acquired absence of genital organ 03/03/2021   Acute cholecystitis 03/03/2021   Allergic rhinitis 03/03/2021   Cellulitis and abscess of leg 03/03/2021   Symptomatic menopausal or female climacteric states 03/03/2021   Sprain and strain of hip and thigh 03/03/2021   Somatic dysfunction of lumbar region 03/03/2021   Skin sensation disturbance 03/03/2021   Screen for colon cancer 03/03/2021   Reduced libido 03/03/2021   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   Pain in joint, pelvic region and thigh 03/03/2021   Nonallopathic lesion of sacral region 03/03/2021   Neuralgia 03/03/2021   Myalgia and myositis 03/03/2021   Microscopic hematuria 03/03/2021   Meralgia paresthetica 03/03/2021   Chronic pain syndrome 03/03/2021   Heartburn 03/03/2021   Hyperlipidemia 03/03/2021   Lower back pain 03/03/2021   Body mass index (BMI) 30.0-30.9, adult 12/11/2019   HSV (herpes simplex virus) anogenital infection 05/31/2019   Allergy to pollen 05/11/2019   Vertigo 09/21/2017   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   Lumbar radiculopathy 03/22/2015   Lumbar degenerative disc disease 03/22/2015   Abnormal mammogram see [facility] mm stable left breast mass since 2013 felt to be benign  08/13/2014   Osteopenia  Dexa done 07/20/2014  [facility] 08/06/2014   DJD (degenerative joint disease) 02/06/2014   HTN (hypertension) 02/06/2014   Tobacco use disorder  quit 20 years ago  02/06/2014   Marijuana use, episodic 02/06/2014   S/P hysterectomy with oophorectomy  has right ovary  02/06/2014   GERD (gastroesophageal reflux  disease) 02/06/2014    Current Outpatient Medications  Medication Sig Dispense Refill   Cholecalciferol (VITAMIN D3 PO) Take 1 tablet by mouth daily.     ezetimibe  (ZETIA ) 10 MG tablet Take 1 tablet (10 mg total) by mouth daily. 90 tablet 1   felodipine  (PLENDIL ) 10 MG 24 hr tablet TAKE 1 TABLET(10 MG) BY MOUTH DAILY 90 tablet 3   fosinopril  (MONOPRIL ) 20 MG tablet TAKE 1 TABLET(20 MG) BY MOUTH DAILY 90 tablet 3   gabapentin  (NEURONTIN ) 100 MG capsule Take 2 capsules (200 mg total) by mouth at bedtime as needed. 60 capsule 0   inclisiran (LEQVIO ) 284 MG/1.5ML SOSY injection Inject 284 mg into the skin every 6 (six) months.     loratadine  (CLARITIN ) 10 MG tablet Take 10 mg by mouth daily.     ondansetron  (ZOFRAN ) 4 MG tablet Take 1 tablet (4 mg total) by mouth every 8 (eight) hours as needed for nausea or vomiting. 20 tablet 0   pantoprazole  (PROTONIX ) 40 MG tablet TAKE 1 TABLET(40 MG) BY MOUTH DAILY 90 tablet 3   PREVIDENT 5000 SENSITIVE 1.1-5 % GEL Apply 1 Application topically daily.     amoxicillin  (AMOXIL ) 500 MG capsule TAKE 4 CAPSULES BY MOUTH 1 HOURS BEFORE DENTAL WORK (Patient not taking: Reported on 12/16/2023)     celecoxib  (CELEBREX ) 100 MG capsule Take 1 capsule (100 mg total) by mouth daily. 30 capsule 1   No current facility-administered medications for this visit.    Allergies: Prednisone and Statins  Past Medical History:  Diagnosis Date   Arthritis    Hypertension  Past Surgical History:  Procedure Laterality Date   ABDOMINAL HYSTERECTOMY  1987   FOOT SURGERY Bilateral 04/20/2000   HERNIA REPAIR     TOTAL HIP ARTHROPLASTY Bilateral 04/20/2006   WISDOM TOOTH EXTRACTION  1986   XI ROBOTIC ASSISTED VENTRAL HERNIA N/A 06/06/2021   Procedure: ROBOTIC INCISIONAL HERNIA REPAIR WITH MESH;  Surgeon: Stechschulte, Deward PARAS, MD;  Location: [facility];  Service: General;  Laterality: N/A;    Family History  Problem Relation Age of Onset   Lung cancer Mother    Hypertension  Mother    Hypertension Brother    Bone cancer Maternal Grandmother     Social History   Tobacco Use   Smoking status: Former    Current packs/day: 0.00    Average packs/day: 0.5 packs/day for 20.0 years (10.0 ttl pk-yrs)    Types: Cigarettes    Start date: 42    Quit date: 1998    Years since quitting: 27.6   Smokeless tobacco: Never  Substance Use Topics   Alcohol use: Not Currently    Subjective:   Accompanied by her husband; was hospitalized with acute pancreatitis from 8/17-8/20; thought to be possible dehydration- hydrochlorothiazide  was discontinued; patient is feeling better; was told to follow up for re-check labs; has had no further abdominal pain/ no diarrhea/ no fever/ no nausea or vomiting;   Objective:  Vitals:   12/16/23 1450  BP: 132/84  Pulse: 65  SpO2: 97%  Weight: 171 lb 12.8 oz (77.9 kg)  Height: 5' 6 (1.676 m)    General: Well developed, well nourished, in no acute distress  Skin : Warm and dry.  Head: Normocephalic and atraumatic  Lungs: Respirations unlabored; clear to auscultation bilaterally without wheeze, rales, rhonchi  CVS exam: normal rate and regular rhythm.  Abdomen: Soft; nontender; nondistended; normoactive bowel sounds; no masses or hepatosplenomegaly  Neurologic: Alert and oriented; speech intact; face symmetrical; moves all extremities well; CNII-XII intact without focal deficit   Assessment:  1. Acute pancreatitis, unspecified complication status, unspecified pancreatitis type   2. Hyperlipidemia, unspecified hyperlipidemia type   3. Abnormal laboratory test result     Plan:  Physical exam is reassuring and patient is feeling back to normal;. Blood pressure appears stable on current regimen- will continue to hold hydrochlorothiazide ; update labs today; follow up as needed based on labs;   I personally spent a total of 30 minutes in the care of the patient today including preparing to see the patient, getting/reviewing separately  obtained history, performing a medically appropriate exam/evaluation, and placing orders.   No follow-ups on file.  Orders Placed This Encounter  Procedures   CBC with Differential/Platelet   Comp Met (CMET)   Magnesium   Amylase   Lipase    Requested Prescriptions   Signed Prescriptions Disp Refills   celecoxib  (CELEBREX ) 100 MG capsule 30 capsule 1    Sig: Take 1 capsule (100 mg total) by mouth daily.

## 2023-12-17 ENCOUNTER — Ambulatory Visit: Payer: Self-pay | Admitting: Family

## 2023-12-17 LAB — CBC WITH DIFFERENTIAL/PLATELET
Basophils Absolute: 0.1 K/uL (ref 0.0–0.1)
Basophils Relative: 1.3 % (ref 0.0–3.0)
Eosinophils Absolute: 0.1 K/uL (ref 0.0–0.7)
Eosinophils Relative: 1.2 % (ref 0.0–5.0)
HCT: 42.6 % (ref 36.0–46.0)
Hemoglobin: 14.2 g/dL (ref 12.0–15.0)
Lymphocytes Relative: 29.3 % (ref 12.0–46.0)
Lymphs Abs: 2.6 K/uL (ref 0.7–4.0)
MCHC: 33.4 g/dL (ref 30.0–36.0)
MCV: 91.4 fl (ref 78.0–100.0)
Monocytes Absolute: 0.5 K/uL (ref 0.1–1.0)
Monocytes Relative: 5.5 % (ref 3.0–12.0)
Neutro Abs: 5.5 K/uL (ref 1.4–7.7)
Neutrophils Relative %: 62.7 % (ref 43.0–77.0)
Platelets: 324 K/uL (ref 150.0–400.0)
RBC: 4.67 Mil/uL (ref 3.87–5.11)
RDW: 14.9 % (ref 11.5–15.5)
WBC: 8.7 K/uL (ref 4.0–10.5)

## 2023-12-17 LAB — COMPREHENSIVE METABOLIC PANEL WITH GFR
ALT: 20 U/L (ref 0–35)
AST: 20 U/L (ref 0–37)
Albumin: 4 g/dL (ref 3.5–5.2)
Alkaline Phosphatase: 71 U/L (ref 39–117)
BUN: 13 mg/dL (ref 6–23)
CO2: 29 meq/L (ref 19–32)
Calcium: 9.5 mg/dL (ref 8.4–10.5)
Chloride: 105 meq/L (ref 96–112)
Creatinine, Ser: 0.89 mg/dL (ref 0.40–1.20)
GFR: 63.14 mL/min (ref >60.00)
Glucose, Bld: 99 mg/dL (ref 70–99)
Potassium: 4.2 meq/L (ref 3.5–5.1)
Sodium: 143 meq/L (ref 135–145)
Total Bilirubin: 0.6 mg/dL (ref 0.2–1.2)
Total Protein: 6.5 g/dL (ref 6.0–8.3)

## 2023-12-17 LAB — MAGNESIUM: Magnesium: 2.2 mg/dL (ref 1.5–2.5)

## 2023-12-17 LAB — AMYLASE: Amylase: 63 U/L (ref 27–131)

## 2023-12-17 LAB — LIPASE: Lipase: 28 U/L (ref 11.0–59.0)

## 2024-01-04 ENCOUNTER — Ambulatory Visit (INDEPENDENT_AMBULATORY_CARE_PROVIDER_SITE_OTHER): Admitting: Family

## 2024-01-04 ENCOUNTER — Encounter: Payer: Self-pay | Admitting: Family

## 2024-01-04 VITALS — BP 136/88 | HR 74 | Ht 66.0 in | Wt 167.2 lb

## 2024-01-04 DIAGNOSIS — I10 Essential (primary) hypertension: Secondary | ICD-10-CM | POA: Diagnosis not present

## 2024-01-04 DIAGNOSIS — E785 Hyperlipidemia, unspecified: Secondary | ICD-10-CM | POA: Diagnosis not present

## 2024-01-04 DIAGNOSIS — J309 Allergic rhinitis, unspecified: Secondary | ICD-10-CM | POA: Diagnosis not present

## 2024-01-04 MED ORDER — PANTOPRAZOLE SODIUM 40 MG PO TBEC: 40.0000 mg | DELAYED_RELEASE_TABLET | Freq: Every day | ORAL | 0 refills | Status: DC

## 2024-01-04 MED ORDER — FELODIPINE ER 10 MG PO TB24: 10.0000 mg | ORAL_TABLET | Freq: Every day | ORAL | 0 refills | Status: DC

## 2024-01-04 MED ORDER — FOSINOPRIL SODIUM 20 MG PO TABS: 20.0000 mg | ORAL_TABLET | Freq: Every day | ORAL | 0 refills | Status: DC

## 2024-01-04 MED ORDER — EZETIMIBE 10 MG PO TABS: 10.0000 mg | ORAL_TABLET | Freq: Every day | ORAL | 0 refills | Status: DC

## 2024-01-04 NOTE — Patient Instructions (Addendum)
 As we have discussed, you will need to find a PCP; I will be able to do your refills for 3 months;  You already have refills on the Protonix .

## 2024-01-04 NOTE — Progress Notes (Signed)
 Kathryn Pruitt is a 78 y.o. 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   Osteoarthrosis, pelvic region and thigh 08/06/2021   Incisional hernia 06/06/2021   Acquired absence of genital organ 03/03/2021   Acute cholecystitis 03/03/2021   Allergic rhinitis 03/03/2021   Cellulitis and abscess of leg 03/03/2021   Symptomatic menopausal or female climacteric states 03/03/2021   Sprain and strain of hip and thigh 03/03/2021   Somatic dysfunction of lumbar region 03/03/2021   Skin sensation disturbance 03/03/2021   Screen for colon cancer 03/03/2021   Reduced libido 03/03/2021   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   Pain in joint, pelvic region and thigh 03/03/2021   Nonallopathic lesion of sacral region 03/03/2021   Neuralgia 03/03/2021   Myalgia and myositis 03/03/2021   Microscopic hematuria 03/03/2021   Meralgia paresthetica 03/03/2021   Chronic pain syndrome 03/03/2021   Heartburn 03/03/2021   Hyperlipidemia 03/03/2021   Lower back pain 03/03/2021   Body mass index (BMI) 30.0-30.9, adult 12/11/2019   HSV (herpes simplex virus) anogenital infection 05/31/2019   Allergy to pollen 05/11/2019   Vertigo 09/21/2017   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   Lumbar radiculopathy 03/22/2015   Lumbar degenerative disc disease 03/22/2015   Abnormal mammogram see [facility] mm stable left breast mass since 2013 felt to be benign  08/13/2014   Osteopenia  Dexa done 07/20/2014  [facility] 08/06/2014   DJD (degenerative joint disease) 02/06/2014   HTN (hypertension) 02/06/2014   Tobacco use disorder  quit 20 years ago  02/06/2014   Marijuana use, episodic 02/06/2014   S/P hysterectomy with oophorectomy  has right ovary  02/06/2014   GERD (gastroesophageal reflux  disease) 02/06/2014    Current Outpatient Medications  Medication Sig Dispense Refill   amoxicillin  (AMOXIL ) 500 MG capsule TAKE 4 CAPSULES BY MOUTH 1 HOURS BEFORE DENTAL WORK     celecoxib  (CELEBREX ) 100 MG capsule Take 1 capsule (100 mg total) by mouth daily. 30 capsule 1   Cholecalciferol (VITAMIN D3 PO) Take 1 tablet by mouth daily.     gabapentin  (NEURONTIN ) 100 MG capsule Take 2 capsules (200 mg total) by mouth at bedtime as needed. 60 capsule 0   inclisiran (LEQVIO ) 284 MG/1.5ML SOSY injection Inject 284 mg into the skin every 6 (six) months.     loratadine  (CLARITIN ) 10 MG tablet Take 10 mg by mouth daily.     ondansetron  (ZOFRAN ) 4 MG tablet Take 1 tablet (4 mg total) by mouth every 8 (eight) hours as needed for nausea or vomiting. 20 tablet 0   PREVIDENT 5000 SENSITIVE 1.1-5 % GEL Apply 1 Application topically daily.     ezetimibe  (ZETIA ) 10 MG tablet Take 1 tablet (10 mg total) by mouth daily. 90 tablet 0   felodipine  (PLENDIL ) 10 MG 24 hr tablet Take 1 tablet (10 mg total) by mouth daily. 90 tablet 0   fosinopril  (MONOPRIL ) 20 MG tablet Take 1 tablet (20 mg total) by mouth daily. 90 tablet 0   pantoprazole  (PROTONIX ) 40 MG tablet Take 1 tablet (40 mg total) by mouth daily. 90 tablet 0   No current facility-administered medications for this visit.    Allergies: Prednisone and Statins  Past Medical History:  Diagnosis Date   Arthritis    Hypertension  Past Surgical History:  Procedure Laterality Date   ABDOMINAL HYSTERECTOMY  1987   FOOT SURGERY Bilateral 04/20/2000   HERNIA REPAIR     TOTAL HIP ARTHROPLASTY Bilateral 04/20/2006   WISDOM TOOTH EXTRACTION  1986   XI ROBOTIC ASSISTED VENTRAL HERNIA N/A 06/06/2021   Procedure: ROBOTIC INCISIONAL HERNIA REPAIR WITH MESH;  Surgeon: Stechschulte, Deward PARAS, MD;  Location: [facility];  Service: General;  Laterality: N/A;    Family History  Problem Relation Age of Onset   Lung cancer Mother    Hypertension Mother    Hypertension  Brother    Bone cancer Maternal Grandmother     Social History   Tobacco Use   Smoking status: Former    Current packs/day: 0.00    Average packs/day: 0.5 packs/day for 20.0 years (10.0 ttl pk-yrs)    Types: Cigarettes    Start date: 21    Quit date: 1998    Years since quitting: 27.7   Smokeless tobacco: Never  Substance Use Topics   Alcohol use: Not Currently    Subjective:   Presents for medication follow up- needs refills on her blood pressure medications until she is able to establish with new PCP; Denies any chest pain, shortness of breath, blurred vision or headache  She is worried about post- nasal drainage; notes she has been feeling better in the past few days; no sinus pain or pressure; no sore throat;   Objective:  Vitals:   01/04/24 0924  BP: 136/88  Pulse: 74  SpO2: 100%  Weight: 167 lb 3.2 oz (75.8 kg)  Height: 5' 6 (1.676 m)    General: Well developed, well nourished, in no acute distress  Skin : Warm and dry.  Head: Normocephalic and atraumatic  Eyes: Sclera and conjunctiva clear; pupils round and reactive to light; extraocular movements intact  Ears: External normal; canals clear; tympanic membranes normal  Oropharynx: Pink, supple. No suspicious lesions  Neck: Supple without thyromegaly, adenopathy  Lungs: Respirations unlabored; clear to auscultation bilaterally without wheeze, rales, rhonchi  CVS exam: normal rate and regular rhythm.  Neurologic: Alert and oriented; speech intact; face symmetrical; moves all extremities well; CNII-XII intact without focal deficit   Assessment:  1. Primary hypertension   2. Hyperlipidemia, unspecified hyperlipidemia type   3. Allergic rhinitis, unspecified seasonality, unspecified trigger     Plan:  Stable; refills updated;  Reassurance that do not see any symptoms of infection- suspect allergic rhinitis; continue Claritin ;   No follow-ups on file.  No orders of the defined types were placed in this  encounter.   Requested Prescriptions   Signed Prescriptions Disp Refills   felodipine  (PLENDIL ) 10 MG 24 hr tablet 90 tablet 0    Sig: Take 1 tablet (10 mg total) by mouth daily.   ezetimibe  (ZETIA ) 10 MG tablet 90 tablet 0    Sig: Take 1 tablet (10 mg total) by mouth daily.   fosinopril  (MONOPRIL ) 20 MG tablet 90 tablet 0    Sig: Take 1 tablet (20 mg total) by mouth daily.   pantoprazole  (PROTONIX ) 40 MG tablet 90 tablet 0    Sig: Take 1 tablet (40 mg total) by mouth daily.

## 2024-02-24 ENCOUNTER — Other Ambulatory Visit: Payer: Self-pay | Admitting: Neurology

## 2024-02-24 MED ORDER — FOSINOPRIL SODIUM 20 MG PO TABS: 20.0000 mg | ORAL_TABLET | Freq: Every day | ORAL | 0 refills | Status: DC

## 2024-02-29 ENCOUNTER — Other Ambulatory Visit (HOSPITAL_BASED_OUTPATIENT_CLINIC_OR_DEPARTMENT_OTHER): Payer: Self-pay

## 2024-02-29 ENCOUNTER — Encounter: Payer: Self-pay | Admitting: Internal Medicine

## 2024-04-04 ENCOUNTER — Encounter: Payer: Self-pay | Admitting: Family Medicine

## 2024-04-04 ENCOUNTER — Encounter: Payer: Self-pay | Admitting: Internal Medicine

## 2024-04-04 ENCOUNTER — Other Ambulatory Visit (HOSPITAL_BASED_OUTPATIENT_CLINIC_OR_DEPARTMENT_OTHER): Payer: Self-pay

## 2024-04-04 ENCOUNTER — Ambulatory Visit: Admitting: Family

## 2024-04-04 ENCOUNTER — Ambulatory Visit: Admitting: Family Medicine

## 2024-04-04 VITALS — BP 137/67 | HR 59 | Temp 97.8°F | Ht 66.0 in | Wt 166.0 lb

## 2024-04-04 DIAGNOSIS — E785 Hyperlipidemia, unspecified: Secondary | ICD-10-CM | POA: Diagnosis not present

## 2024-04-04 DIAGNOSIS — I10 Essential (primary) hypertension: Secondary | ICD-10-CM | POA: Diagnosis not present

## 2024-04-04 DIAGNOSIS — Z23 Encounter for immunization: Secondary | ICD-10-CM

## 2024-04-04 DIAGNOSIS — M5416 Radiculopathy, lumbar region: Secondary | ICD-10-CM | POA: Diagnosis not present

## 2024-04-04 DIAGNOSIS — K219 Gastro-esophageal reflux disease without esophagitis: Secondary | ICD-10-CM

## 2024-04-04 DIAGNOSIS — R7303 Prediabetes: Secondary | ICD-10-CM

## 2024-04-04 DIAGNOSIS — Z0000 Encounter for general adult medical examination without abnormal findings: Secondary | ICD-10-CM

## 2024-04-04 LAB — CBC WITH DIFFERENTIAL/PLATELET
Basophils Absolute: 0 K/uL (ref 0.0–0.1)
Basophils Relative: 0.5 % (ref 0.0–3.0)
Eosinophils Absolute: 0.1 K/uL (ref 0.0–0.7)
Eosinophils Relative: 1.3 % (ref 0.0–5.0)
HCT: 44.7 % (ref 36.0–46.0)
Hemoglobin: 14.9 g/dL (ref 12.0–15.0)
Lymphocytes Relative: 49.2 % — ABNORMAL HIGH (ref 12.0–46.0)
Lymphs Abs: 3.1 K/uL (ref 0.7–4.0)
MCHC: 33.3 g/dL (ref 30.0–36.0)
MCV: 92 fl (ref 78.0–100.0)
Monocytes Absolute: 0.5 K/uL (ref 0.1–1.0)
Monocytes Relative: 7.5 % (ref 3.0–12.0)
Neutro Abs: 2.7 K/uL (ref 1.4–7.7)
Neutrophils Relative %: 41.5 % — ABNORMAL LOW (ref 43.0–77.0)
Platelets: 270 K/uL (ref 150.0–400.0)
RBC: 4.86 Mil/uL (ref 3.87–5.11)
RDW: 15.1 % (ref 11.5–15.5)
WBC: 6.4 K/uL (ref 4.0–10.5)

## 2024-04-04 LAB — LIPID PANEL
Cholesterol: 156 mg/dL (ref 28–200)
HDL: 62.5 mg/dL (ref >39.00)
LDL Cholesterol: 74 mg/dL (ref 10–99)
NonHDL: 93
Total CHOL/HDL Ratio: 2
Triglycerides: 93 mg/dL (ref 10.0–149.0)
VLDL: 18.6 mg/dL (ref 0.0–40.0)

## 2024-04-04 LAB — COMPREHENSIVE METABOLIC PANEL WITH GFR
ALT: 15 U/L (ref 3–35)
AST: 23 U/L (ref 5–37)
Albumin: 4.6 g/dL (ref 3.5–5.2)
Alkaline Phosphatase: 85 U/L (ref 39–117)
BUN: 18 mg/dL (ref 6–23)
CO2: 28 meq/L (ref 19–32)
Calcium: 9.8 mg/dL (ref 8.4–10.5)
Chloride: 106 meq/L (ref 96–112)
Creatinine, Ser: 0.9 mg/dL (ref 0.40–1.20)
GFR: 62.17 mL/min (ref >60.00)
Glucose, Bld: 95 mg/dL (ref 70–99)
Potassium: 4.6 meq/L (ref 3.5–5.1)
Sodium: 143 meq/L (ref 135–145)
Total Bilirubin: 1.2 mg/dL (ref 0.2–1.2)
Total Protein: 7 g/dL (ref 6.0–8.3)

## 2024-04-04 LAB — TSH: TSH: 2.21 u[IU]/mL (ref 0.35–5.50)

## 2024-04-04 LAB — HEMOGLOBIN A1C: Hgb A1c MFr Bld: 5.8 % (ref 4.6–6.5)

## 2024-04-04 MED ORDER — PANTOPRAZOLE SODIUM 40 MG PO TBEC: 40.0000 mg | DELAYED_RELEASE_TABLET | Freq: Every day | ORAL | 1 refills | Status: AC

## 2024-04-04 MED ORDER — FOSINOPRIL SODIUM 20 MG PO TABS: 20.0000 mg | ORAL_TABLET | Freq: Every day | ORAL | 1 refills | Status: AC

## 2024-04-04 MED ORDER — EZETIMIBE 10 MG PO TABS: 10.0000 mg | ORAL_TABLET | Freq: Every day | ORAL | 1 refills | Status: AC

## 2024-04-04 MED ORDER — GABAPENTIN 100 MG PO CAPS: 200.0000 mg | ORAL_CAPSULE | Freq: Every evening | ORAL | 0 refills | Status: DC | PRN

## 2024-04-04 MED ORDER — FELODIPINE ER 10 MG PO TB24: 10.0000 mg | ORAL_TABLET | Freq: Every day | ORAL | 1 refills | Status: AC

## 2024-04-04 MED ORDER — CELECOXIB 100 MG PO CAPS
100.0000 mg | ORAL_CAPSULE | Freq: Every day | ORAL | 1 refills | Status: AC
  Filled 2024-04-04 (×2): qty 30, 30d supply, fill #0
  Filled 2024-05-02: qty 30, 30d supply, fill #1

## 2024-04-04 NOTE — Assessment & Plan Note (Signed)
 Blood pressure is at goal for age and co-morbidities.  Following with cardiology.  Recommendations: continue fosinopril  20 mg daily and felodipine  10 mg daily  - BP goal <130/80 - monitor and log blood pressures at home - check around the same time each day in a relaxed setting - Limit salt to <2000 mg/day - Follow DASH eating plan (heart healthy diet) - limit alcohol to 2 standard drinks per day for men and 1 per day for women - avoid tobacco products - get at least 2 hours of regular aerobic exercise weekly Patient aware of signs/symptoms requiring further/urgent evaluation. Labs updated today.

## 2024-04-04 NOTE — Progress Notes (Signed)
 New Patient Office Visit   Subjective     Patient ID: Kathryn Pruitt, female   DOB: Apr 23, 1945  Age: 78 y.o. MRN: [id number]   CC:     HPI Kathryn Pruitt presents to establish/transfer care. She lives with her husband and has a local daughter. No acute complaints today.    Hypertension: Following with [facility] - Medications: Fosinopril  20 mg daily and Felodipine  10 mg daily.  (HCTZ recently stopped d/t recent pancreatitis) - Compliance: good, not taken yet today  - Checking BP at home: rarely - Denies any SOB, recurrent headaches, CP, vision changes, LE edema, dizziness, palpitations, or medication side effects. - Diet: general - Exercise: not regularly    Hyperlipidemia: - medications: Zetia  10 mg daily, Leqvio  284 mg q6 months.  - compliance: good - medication SEs: none The 10-year ASCVD risk score (Arnett DK, et al., 2019) is: 16.9%   Values used to calculate the score:     Age: 72 years     Clinically relevant sex: Female     Is Non-Hispanic African American: Yes     Diabetic: No     Tobacco smoker: No     Systolic Blood Pressure: 137 mmHg     Is BP treated: Yes     HDL Cholesterol: 68.9 mg/dL     Total Cholesterol: 172 mg/dL    Pre-Diabetes: - Checking glucose at home: rarely - Medications: none - Compliance: n/a - Denies symptoms of hypoglycemia, polyuria, polydipsia, numbness extremities, foot ulcers/trauma, wounds that are not healing, medication side effects  Lab Results  Component Value Date   HGBA1C 6.4 (H) 10/01/2023    Radiculopathy - nerve pain to neck/shoulders and legs: - gabapentin  200 mg at bedtime PRN     Show/hide medication list[1] Past Medical History:  Diagnosis Date   Acute pancreatitis 12/05/2023   Arthritis    Hypertension    S/P hysterectomy with oophorectomy  has right ovary  02/06/2014    Past Surgical History:  Procedure Laterality Date   ABDOMINAL HYSTERECTOMY  1987   FOOT SURGERY Bilateral 04/20/2000    HERNIA REPAIR     TOTAL HIP ARTHROPLASTY Bilateral 04/20/2006   WISDOM TOOTH EXTRACTION  1986   XI ROBOTIC ASSISTED VENTRAL HERNIA N/A 06/06/2021   Procedure: ROBOTIC INCISIONAL HERNIA REPAIR WITH MESH;  Surgeon: Stechschulte, Deward PARAS, MD;  Location: [facility];  Service: General;  Laterality: N/A;     Family History  Problem Relation Age of Onset   Lung cancer Mother    Hypertension Mother    Hypertension Brother    Bone cancer Maternal Grandmother     Social History   Socioeconomic History   Marital status: Married    Spouse name: Not on file   Number of children: Not on file   Years of education: Not on file   Highest education level: 12th grade  Occupational History   Not on file  Tobacco Use   Smoking status: Former    Current packs/day: 0.00    Average packs/day: 0.5 packs/day for 20.0 years (10.0 ttl pk-yrs)    Types: Cigarettes    Start date: 75    Quit date: 1998    Years since quitting: 27.9   Smokeless tobacco: Never  Vaping Use   Vaping status: Never Used  Substance and Sexual Activity   Alcohol use: Not Currently   Drug use: Not Currently    Types: Marijuana   Sexual activity: Not on file  Other Topics Concern  Not on file  Social History Narrative   Not on file   Social Drivers of Health   Tobacco Use: Medium Risk (04/04/2024)   Patient History    Smoking Tobacco Use: Former    Smokeless Tobacco Use: Never    Passive Exposure: Not on file  Financial Resource Strain: Low Risk (04/03/2024)   Overall Financial Resource Strain (CARDIA)    Difficulty of Paying Living Expenses: Not hard at all  Food Insecurity: No Food Insecurity (04/03/2024)   Epic    Worried About Programme Researcher, Broadcasting/film/video in the Last Year: Never true    Ran Out of Food in the Last Year: Never true  Transportation Needs: No Transportation Needs (04/03/2024)   Epic    Lack of Transportation (Medical): No    Lack of Transportation (Non-Medical): No  Physical Activity: Insufficiently  Active (04/03/2024)   Exercise Vital Sign    Days of Exercise per Week: 1 day    Minutes of Exercise per Session: 20 min  Stress: No Stress Concern Present (04/03/2024)   [employer] of Occupational Health - Occupational Stress Questionnaire    Feeling of Stress: Not at all  Social Connections: Moderately Isolated (04/03/2024)   Social Connection and Isolation Panel    Frequency of Communication with Friends and Family: More than three times a week    Frequency of Social Gatherings with Friends and Family: More than three times a week    Attends Religious Services: Patient declined    Active Member of Clubs or Organizations: No    Attends Engineer, Structural: Not on file    Marital Status: Married  Depression (PHQ2-9): Low Risk (04/04/2024)   Depression (PHQ2-9)    PHQ-2 Score: 0  Alcohol Screen: Low Risk (04/03/2024)   Alcohol Screen    Last Alcohol Screening Score (AUDIT): 1  Housing: Low Risk (04/03/2024)   Epic    Unable to Pay for Housing in the Last Year: No    Number of Times Moved in the Last Year: 0    Homeless in the Last Year: No  Utilities: Not At Risk (12/05/2023)   Epic    Threatened with loss of utilities: No  Health Literacy: Adequate Health Literacy (08/26/2023)   B1300 Health Literacy    Frequency of need for help with medical instructions: Never       ROS All review of systems negative except what is listed in the HPI    Objective     BP 137/67   Pulse (!) 59   Temp 97.8 F (36.6 C) (Oral)   Ht 5' 6 (1.676 m)   Wt 166 lb (75.3 kg)   SpO2 100%   BMI 26.79 kg/m   Physical Exam Vitals reviewed.  Constitutional:      General: She is not in acute distress.    Appearance: Normal appearance. She is not ill-appearing.  Cardiovascular:     Rate and Rhythm: Normal rate and regular rhythm.     Heart sounds: Normal heart sounds.  Pulmonary:     Effort: Pulmonary effort is normal.     Breath sounds: Normal breath sounds.  Skin:     General: Skin is warm and dry.  Neurological:     Mental Status: She is alert and oriented to person, place, and time.  Psychiatric:        Mood and Affect: Mood normal.        Behavior: Behavior normal.        Thought  Content: Thought content normal.        Judgment: Judgment normal.          Assessment & Plan:     Problem List Items Addressed This Visit       Active Problems   HTN (hypertension) (Chronic)   Blood pressure is at goal for age and co-morbidities.  Following with cardiology.  Recommendations: continue fosinopril  20 mg daily and felodipine  10 mg daily  - BP goal <130/80 - monitor and log blood pressures at home - check around the same time each day in a relaxed setting - Limit salt to <2000 mg/day - Follow DASH eating plan (heart healthy diet) - limit alcohol to 2 standard drinks per day for men and 1 per day for women - avoid tobacco products - get at least 2 hours of regular aerobic exercise weekly Patient aware of signs/symptoms requiring further/urgent evaluation. Labs updated today.        Relevant Medications   ezetimibe  (ZETIA ) 10 MG tablet   felodipine  (PLENDIL ) 10 MG 24 hr tablet   fosinopril  (MONOPRIL ) 20 MG tablet   Other Relevant Orders   CBC with Differential/Platelet   Comprehensive metabolic panel with GFR   TSH   GERD (gastroesophageal reflux disease) (Chronic)   Stable with pantoprazole .       Relevant Medications   pantoprazole  (PROTONIX ) 40 MG tablet   Lumbar radiculopathy (Chronic)   Stable with lifestyle measures and occasional bedtime gabapentin       Relevant Medications   celecoxib  (CELEBREX ) 100 MG capsule   gabapentin  (NEURONTIN ) 100 MG capsule   Hyperlipidemia - Primary (Chronic)   Medication management: Zetia , Leqvio  injections Lifestyle factors for lowering cholesterol include: Diet therapy - heart-healthy diet rich in fruits, veggies, fiber-rich whole grains, lean meats, chicken, fish (at least twice a week),  fat-free or 1% dairy products; foods low in saturated/trans fats, cholesterol, sodium, and sugar. Mediterranean diet has shown to be very heart healthy. Regular exercise - recommend at least 30 minutes a day, 5 times per week Weight management  Repeat CMP and lipid panel today       Relevant Medications   ezetimibe  (ZETIA ) 10 MG tablet   felodipine  (PLENDIL ) 10 MG 24 hr tablet   fosinopril  (MONOPRIL ) 20 MG tablet   Other Relevant Orders   Comprehensive metabolic panel with GFR   Lipid panel   Pre-diabetes   Continue lifestyle management - low carb diet, regular exercise  Recheck A1c       Relevant Orders   Comprehensive metabolic panel with GFR   Hemoglobin A1c   Other Visit Diagnoses       Encounter for medical examination to establish care         Immunization due       Relevant Orders   Flu vaccine HIGH DOSE PF(Fluzone Trivalent) (Completed)                Return in about 3 months (around 07/03/2024) for chronic disease management.  Waddell KATHEE Mon, NP  I,Emily Lagle,acting as a scribe for Waddell KATHEE Mon, NP.,have documented all relevant documentation on the behalf of Waddell KATHEE Mon, NP.  I, Waddell KATHEE Mon, NP, have reviewed all documentation for this visit. The documentation on 04/04/2024 for the exam, diagnosis, procedures, and orders are all accurate and complete.     [1]  Outpatient Medications Prior to Visit  Medication Sig   amoxicillin  (AMOXIL ) 500 MG capsule TAKE 4 CAPSULES BY MOUTH 1 HOURS BEFORE DENTAL  WORK   Cholecalciferol (VITAMIN D3 PO) Take 1 tablet by mouth daily.   inclisiran (LEQVIO ) 284 MG/1.5ML SOSY injection Inject 284 mg into the skin every 6 (six) months.   loratadine  (CLARITIN ) 10 MG tablet Take 10 mg by mouth daily.   Multiple Vitamins-Minerals (ONE-A-DAY WOMENS 50 PLUS) TABS Take by mouth once.   ondansetron  (ZOFRAN ) 4 MG tablet Take 1 tablet (4 mg total) by mouth every 8 (eight) hours as needed for nausea or vomiting.   PREVIDENT 5000  SENSITIVE 1.1-5 % GEL Apply 1 Application topically daily.   [DISCONTINUED] celecoxib  (CELEBREX ) 100 MG capsule Take 1 capsule (100 mg total) by mouth daily.   [DISCONTINUED] ezetimibe  (ZETIA ) 10 MG tablet Take 1 tablet (10 mg total) by mouth daily.   [DISCONTINUED] felodipine  (PLENDIL ) 10 MG 24 hr tablet Take 1 tablet (10 mg total) by mouth daily.   [DISCONTINUED] fosinopril  (MONOPRIL ) 20 MG tablet Take 1 tablet (20 mg total) by mouth daily.   [DISCONTINUED] gabapentin  (NEURONTIN ) 100 MG capsule Take 2 capsules (200 mg total) by mouth at bedtime as needed.   [DISCONTINUED] pantoprazole  (PROTONIX ) 40 MG tablet Take 1 tablet (40 mg total) by mouth daily.   No facility-administered medications prior to visit.

## 2024-04-04 NOTE — Assessment & Plan Note (Signed)
 Medication management: Zetia , Leqvio  injections Lifestyle factors for lowering cholesterol include: Diet therapy - heart-healthy diet rich in fruits, veggies, fiber-rich whole grains, lean meats, chicken, fish (at least twice a week), fat-free or 1% dairy products; foods low in saturated/trans fats, cholesterol, sodium, and sugar. Mediterranean diet has shown to be very heart healthy. Regular exercise - recommend at least 30 minutes a day, 5 times per week Weight management  Repeat CMP and lipid panel today

## 2024-04-04 NOTE — Assessment & Plan Note (Signed)
 Stable with lifestyle measures and occasional bedtime gabapentin 

## 2024-04-04 NOTE — Assessment & Plan Note (Signed)
 Stable with pantoprazole

## 2024-04-04 NOTE — Assessment & Plan Note (Signed)
 Continue lifestyle management - low carb diet, regular exercise  Recheck A1c

## 2024-04-05 ENCOUNTER — Ambulatory Visit: Payer: Self-pay | Admitting: Family Medicine

## 2024-04-06 ENCOUNTER — Ambulatory Visit

## 2024-04-06 VITALS — BP 164/91 | HR 58 | Temp 97.7°F | Resp 16 | Ht 65.0 in | Wt 168.0 lb

## 2024-04-06 DIAGNOSIS — E782 Mixed hyperlipidemia: Secondary | ICD-10-CM

## 2024-04-06 DIAGNOSIS — E7841 Elevated Lipoprotein(a): Secondary | ICD-10-CM

## 2024-04-06 MED ORDER — INCLISIRAN SODIUM 284 MG/1.5ML ~~LOC~~ SOSY
284.0000 mg | PREFILLED_SYRINGE | Freq: Once | SUBCUTANEOUS | Status: AC
  Administered 2024-04-06: 09:00:00 284 mg via SUBCUTANEOUS
  Filled 2024-04-06: qty 1.5

## 2024-04-06 NOTE — Progress Notes (Signed)
 Diagnosis: Hyperlipidemia  Provider:  Chilton Greathouse MD  Procedure: Injection  Leqvio (inclisiran), Dose: 284 mg, Site: subcutaneous, Number of injections: 1  Injection Site(s): Left upper quad. abdomen  Post Care: Patient declined observation  Discharge: Condition: Good, Destination: Home . AVS Provided  Performed by:  Wyvonne Lenz, RN

## 2024-04-25 ENCOUNTER — Telehealth: Payer: Self-pay

## 2024-04-25 NOTE — Telephone Encounter (Signed)
 Auth Submission: NO AUTH NEEDED Site of care: Site of care: [facility] Payer: Medicare A/B with [employer] for Life Medication & CPT/J Code(s) submitted: Leqvio  (Inclisiran) J1306 Diagnosis Code:  Route of submission (phone, fax, portal):  Phone # Fax # Auth type: Buy/Bill PB Units/visits requested:  Reference number: 284mg  x 2 doses Approval from: 04/25/24 to 05/20/25

## 2024-05-02 ENCOUNTER — Other Ambulatory Visit: Payer: Self-pay | Admitting: Family Medicine

## 2024-05-02 ENCOUNTER — Other Ambulatory Visit (HOSPITAL_BASED_OUTPATIENT_CLINIC_OR_DEPARTMENT_OTHER): Payer: Self-pay

## 2024-05-02 ENCOUNTER — Encounter: Payer: Self-pay | Admitting: Internal Medicine

## 2024-05-02 DIAGNOSIS — M5416 Radiculopathy, lumbar region: Secondary | ICD-10-CM

## 2024-07-05 ENCOUNTER — Ambulatory Visit: Admitting: Family Medicine

## 2024-08-30 ENCOUNTER — Ambulatory Visit

## 2024-10-06 ENCOUNTER — Ambulatory Visit
